# Patient Record
Sex: Female | Born: 1980 | Race: Black or African American | Hispanic: No | Marital: Single | Smoking: Former smoker
Health system: Southern US, Community
[De-identification: ages and names within clinical notes are randomized; demographics above are authoritative.]

## PROBLEM LIST (undated history)

## (undated) ENCOUNTER — Emergency Department (HOSPITAL_COMMUNITY): Admission: EM | Disposition: A | Discharge status: Home / Self Care | Payer: Medicaid Other

## (undated) DIAGNOSIS — M549 Dorsalgia, unspecified: Secondary | ICD-10-CM

## (undated) DIAGNOSIS — M5136 Other intervertebral disc degeneration, lumbar region: Secondary | ICD-10-CM

## (undated) DIAGNOSIS — M51369 Other intervertebral disc degeneration, lumbar region without mention of lumbar back pain or lower extremity pain: Secondary | ICD-10-CM

## (undated) DIAGNOSIS — G5603 Carpal tunnel syndrome, bilateral upper limbs: Secondary | ICD-10-CM

## (undated) DIAGNOSIS — G8929 Other chronic pain: Secondary | ICD-10-CM

## (undated) DIAGNOSIS — D649 Anemia, unspecified: Secondary | ICD-10-CM

## (undated) HISTORY — PX: CHOLECYSTECTOMY: SHX55

## (undated) HISTORY — PX: CERVICAL BIOPSY: SHX590

---

## 2013-06-21 HISTORY — PX: ROTATOR CUFF REPAIR: SHX139

## 2014-03-21 ENCOUNTER — Emergency Department (HOSPITAL_COMMUNITY)
Admission: EM | Admit: 2014-03-21 | Discharge: 2014-03-21 | Disposition: A | Discharge status: Home / Self Care | Payer: Medicaid Other | Attending: Emergency Medicine | Admitting: Emergency Medicine

## 2014-03-21 ENCOUNTER — Encounter (HOSPITAL_COMMUNITY): Payer: Self-pay | Admitting: Emergency Medicine

## 2014-03-21 DIAGNOSIS — H9202 Otalgia, left ear: Secondary | ICD-10-CM | POA: Insufficient documentation

## 2014-03-21 DIAGNOSIS — J029 Acute pharyngitis, unspecified: Secondary | ICD-10-CM | POA: Diagnosis present

## 2014-03-21 DIAGNOSIS — J069 Acute upper respiratory infection, unspecified: Secondary | ICD-10-CM | POA: Insufficient documentation

## 2014-03-21 DIAGNOSIS — M25519 Pain in unspecified shoulder: Secondary | ICD-10-CM | POA: Diagnosis not present

## 2014-03-21 DIAGNOSIS — J0110 Acute frontal sinusitis, unspecified: Secondary | ICD-10-CM | POA: Diagnosis not present

## 2014-03-21 MED ORDER — AMOXICILLIN 500 MG PO CAPS: 500.0000 mg | ORAL_CAPSULE | Freq: Three times a day (TID) | ORAL | Status: DC

## 2014-03-21 MED ORDER — AMOXICILLIN 500 MG PO CAPS
500.0000 mg | ORAL_CAPSULE | Freq: Three times a day (TID) | ORAL | Status: DC
Start: 2014-03-21 — End: 2014-03-21

## 2014-03-21 MED ORDER — FLUTICASONE PROPIONATE 50 MCG/ACT NA SUSP: 2.0000 | Freq: Every day | NASAL | Status: DC

## 2014-03-21 NOTE — ED Provider Notes (Signed)
Medical screening examination/treatment/procedure(s) were performed by non-physician practitioner and as supervising physician I was immediately available for consultation/collaboration.  Arla Boutwell, MD 03/21/14 1526 

## 2014-03-21 NOTE — ED Notes (Signed)
Pt in c/o left earache and sore throat over the last few days, unsure of fever, also reports right shoulder pain from a injury a few months ago, no distress noted

## 2014-03-21 NOTE — Discharge Instructions (Signed)
Take antibiotic to completion. Use nasal spray as directed. Follow up with one of the orthopedists. Another orthopedic facility is [facility] [phone] Upper Respiratory Infection, Adult An upper respiratory infection (URI) is also sometimes known as the common cold. The upper respiratory tract includes the nose, sinuses, throat, trachea, and bronchi. Bronchi are the airways leading to the lungs. Most people improve within 1 week, but symptoms can last up to 2 weeks. A residual cough may last even longer.  CAUSES Many different viruses can infect the tissues lining the upper respiratory tract. The tissues become irritated and inflamed and often become very moist. Mucus production is also common. A cold is contagious. You can easily spread the virus to others by oral contact. This includes kissing, sharing a glass, coughing, or sneezing. Touching your mouth or nose and then touching a surface, which is then touched by another person, can also spread the virus. SYMPTOMS  Symptoms typically develop 1 to 3 days after you come in contact with a cold virus. Symptoms vary from person to person. They may include:  Runny nose.  Sneezing.  Nasal congestion.  Sinus irritation.  Sore throat.  Loss of voice (laryngitis).  Cough.  Fatigue.  Muscle aches.  Loss of appetite.  Headache.  Low-grade fever. DIAGNOSIS  You might diagnose your own cold based on familiar symptoms, since most people get a cold 2 to 3 times a year. Your caregiver can confirm this based on your exam. Most importantly, your caregiver can check that your symptoms are not due to another disease such as strep throat, sinusitis, pneumonia, asthma, or epiglottitis. Blood tests, throat tests, and X-rays are not necessary to diagnose a common cold, but they may sometimes be helpful in excluding other more serious diseases. Your caregiver will decide if any further tests are required. RISKS AND COMPLICATIONS  You may be at  risk for a more severe case of the common cold if you smoke cigarettes, have chronic heart disease (such as heart failure) or lung disease (such as asthma), or if you have a weakened immune system. The very young and very old are also at risk for more serious infections. Bacterial sinusitis, middle ear infections, and bacterial pneumonia can complicate the common cold. The common cold can worsen asthma and chronic obstructive pulmonary disease (COPD). Sometimes, these complications can require emergency medical care and may be life-threatening. PREVENTION  The best way to protect against getting a cold is to practice good hygiene. Avoid oral or hand contact with people with cold symptoms. Wash your hands often if contact occurs. There is no clear evidence that vitamin C, vitamin E, echinacea, or exercise reduces the chance of developing a cold. However, it is always recommended to get plenty of rest and practice good nutrition. TREATMENT  Treatment is directed at relieving symptoms. There is no cure. Antibiotics are not effective, because the infection is caused by a virus, not by bacteria. Treatment may include:  Increased fluid intake. Sports drinks offer valuable electrolytes, sugars, and fluids.  Breathing heated mist or steam (vaporizer or shower).  Eating chicken soup or other clear broths, and maintaining good nutrition.  Getting plenty of rest.  Using gargles or lozenges for comfort.  Controlling fevers with ibuprofen or acetaminophen as directed by your caregiver.  Increasing usage of your inhaler if you have asthma. Zinc gel and zinc lozenges, taken in the first 24 hours of the common cold, can shorten the duration and lessen the severity of symptoms. Pain medicines may help  with fever, muscle aches, and throat pain. A variety of non-prescription medicines are available to treat congestion and runny nose. Your caregiver can make recommendations and may suggest nasal or lung inhalers for  other symptoms.  HOME CARE INSTRUCTIONS   Only take over-the-counter or prescription medicines for pain, discomfort, or fever as directed by your caregiver.  Use a warm mist humidifier or inhale steam from a shower to increase air moisture. This may keep secretions moist and make it easier to breathe.  Drink enough water and fluids to keep your urine clear or pale yellow.  Rest as needed.  Return to work when your temperature has returned to normal or as your caregiver advises. You may need to stay home longer to avoid infecting others. You can also use a face mask and careful hand washing to prevent spread of the virus. SEEK MEDICAL CARE IF:   After the first few days, you feel you are getting worse rather than better.  You need your caregiver's advice about medicines to control symptoms.  You develop chills, worsening shortness of breath, or brown or red sputum. These may be signs of pneumonia.  You develop yellow or brown nasal discharge or pain in the face, especially when you bend forward. These may be signs of sinusitis.  You develop a fever, swollen neck glands, pain with swallowing, or white areas in the back of your throat. These may be signs of strep throat. SEEK IMMEDIATE MEDICAL CARE IF:   You have a fever.  You develop severe or persistent headache, ear pain, sinus pain, or chest pain.  You develop wheezing, a prolonged cough, cough up blood, or have a change in your usual mucus (if you have chronic lung disease).  You develop sore muscles or a stiff neck. Document Released: 12/01/2000 Document Revised: 08/30/2011 Document Reviewed: 09/12/2013 [facility] Patient Information 2015 [employer] [place]. This information is not intended to replace advice given to you by your health care provider. Make sure you discuss any questions you have with your health care provider.  Sinusitis Sinusitis is redness, soreness, and inflammation of the paranasal sinuses. Paranasal sinuses are  air pockets within the bones of your face (beneath the eyes, the middle of the forehead, or above the eyes). In healthy paranasal sinuses, mucus is able to drain out, and air is able to circulate through them by way of your nose. However, when your paranasal sinuses are inflamed, mucus and air can become trapped. This can allow bacteria and other germs to grow and cause infection. Sinusitis can develop quickly and last only a short time (acute) or continue over a long period (chronic). Sinusitis that lasts for more than 12 weeks is considered chronic.  CAUSES  Causes of sinusitis include:  Allergies.  Structural abnormalities, such as displacement of the cartilage that separates your nostrils (deviated septum), which can decrease the air flow through your nose and sinuses and affect sinus drainage.  Functional abnormalities, such as when the small hairs (cilia) that line your sinuses and help remove mucus do not work properly or are not present. SIGNS AND SYMPTOMS  Symptoms of acute and chronic sinusitis are the same. The primary symptoms are pain and pressure around the affected sinuses. Other symptoms include:  Upper toothache.  Earache.  Headache.  Bad breath.  Decreased sense of smell and taste.  A cough, which worsens when you are lying flat.  Fatigue.  Fever.  Thick drainage from your nose, which often is green and may contain pus (  purulent).  Swelling and warmth over the affected sinuses. DIAGNOSIS  Your health care provider will perform a physical exam. During the exam, your health care provider may:  Look in your nose for signs of abnormal growths in your nostrils (nasal polyps).  Tap over the affected sinus to check for signs of infection.  View the inside of your sinuses (endoscopy) using an imaging device that has a light attached (endoscope). If your health care provider suspects that you have chronic sinusitis, one or more of the following tests may be  recommended:  Allergy tests.  Nasal culture. A sample of mucus is taken from your nose, sent to a lab, and screened for bacteria.  Nasal cytology. A sample of mucus is taken from your nose and examined by your health care provider to determine if your sinusitis is related to an allergy. TREATMENT  Most cases of acute sinusitis are related to a viral infection and will resolve on their own within 10 days. Sometimes medicines are prescribed to help relieve symptoms (pain medicine, decongestants, nasal steroid sprays, or saline sprays).  However, for sinusitis related to a bacterial infection, your health care provider will prescribe antibiotic medicines. These are medicines that will help kill the bacteria causing the infection.  Rarely, sinusitis is caused by a fungal infection. In theses cases, your health care provider will prescribe antifungal medicine. For some cases of chronic sinusitis, surgery is needed. Generally, these are cases in which sinusitis recurs more than 3 times per year, despite other treatments. HOME CARE INSTRUCTIONS   Drink plenty of water. Water helps thin the mucus so your sinuses can drain more easily.  Use a humidifier.  Inhale steam 3 to 4 times a day (for example, sit in the bathroom with the shower running).  Apply a warm, moist washcloth to your face 3 to 4 times a day, or as directed by your health care provider.  Use saline nasal sprays to help moisten and clean your sinuses.  Take medicines only as directed by your health care provider.  If you were prescribed either an antibiotic or antifungal medicine, finish it all even if you start to feel better. SEEK IMMEDIATE MEDICAL CARE IF:  You have increasing pain or severe headaches.  You have nausea, vomiting, or drowsiness.  You have swelling around your face.  You have vision problems.  You have a stiff neck.  You have difficulty breathing. MAKE SURE YOU:   Understand these  instructions.  Will watch your condition.  Will get help right away if you are not doing well or get worse. Document Released: 06/07/2005 Document Revised: 10/22/2013 Document Reviewed: 06/22/2011 [facility] Patient Information 2015 [place], [place]. This information is not intended to replace advice given to you by your health care provider. Make sure you discuss any questions you have with your health care provider.

## 2014-03-21 NOTE — ED Provider Notes (Signed)
CSN: [id number]     Arrival date & time 03/21/14  1233 History  This chart was scribed for non-physician practitioner, Johnnette Gourdobyn Albert, PA-C, working with Gerhard Munchobert Lockwood, MD by Charline BillsEssence Howell, ED Scribe. This patient was seen in room TR09C/TR09C and the patient's care was started at 1:06 PM.   Chief Complaint  Patient presents with  . Otalgia  . Sore Throat  . Shoulder Pain   The history is provided by the patient. No language interpreter was used.   HPI Comments: Maria Mcmahon is a 33 y.o. female who presents to the Emergency Department complaining of constant L ear pain onset 1 week ago. She reports associated sore throat, postnasal drip, sinus pressure, congestion. She denies fever. Pt has tried OTC sinus medication, ibuprofen and Tylenol with mild relief. Pt also requests referral to ortho for R shoulder pain onset 3.5 weeks. She was seen at an ED in the area she recently moved from, states 3 weeks ago she was moving and hurt the right side of her neck and arm. She was advised to f/u with ortho, however recently got insurance and needs resources in this area.  History reviewed. No pertinent past medical history. No past surgical history on file. No family history on file. History  Substance Use Topics  . Smoking status: Not on file  . Smokeless tobacco: Not on file  . Alcohol Use: Not on file   OB History   Grav Para Term Preterm Abortions TAB SAB Ect Mult Living                 Review of Systems  HENT: Positive for congestion, ear pain, postnasal drip, sinus pressure and sore throat.   Musculoskeletal: Positive for arthralgias.  All other systems reviewed and are negative.  Allergies  Review of patient's allergies indicates no known allergies.  Home Medications   Prior to Admission medications   Medication Sig Start Date End Date Taking? Authorizing Provider  amoxicillin (AMOXIL) 500 MG capsule Take 1 capsule (500 mg total) by mouth 3 (three) times daily. 03/21/14   Trevor Maceobyn  M Albert, PA-C  fluticasone (FLONASE) 50 MCG/ACT nasal spray Place 2 sprays into both nostrils daily. 03/21/14   Trevor Maceobyn M Albert, PA-C   Triage Vitals: BP 125/79  Pulse 76  Temp(Src) 98.5 F (36.9 C) (Oral)  Resp 18  Ht 5\' 9"  (1.753 m)  Wt 270 lb (122.471 kg)  BMI 39.85 kg/m2  SpO2 96% Physical Exam  Nursing note and vitals reviewed. Constitutional: She is oriented to person, place, and time. She appears well-developed and well-nourished. No distress.  HENT:  Head: Normocephalic and atraumatic.  Nose: Right sinus exhibits frontal sinus tenderness. Left sinus exhibits frontal sinus tenderness.  Mouth/Throat: Oropharynx is clear and moist.  Ears: Dullness behind bilateral TMs Nose: Bilateral frontal sinus tenderness Nasal congestion and postnasal drip  Eyes: Conjunctivae and EOM are normal.  Neck: Normal range of motion. Neck supple.  Cardiovascular: Normal rate, regular rhythm and normal heart sounds.   Pulmonary/Chest: Effort normal and breath sounds normal. No respiratory distress.  Musculoskeletal: Normal range of motion. She exhibits no edema.  Neurological: She is alert and oriented to person, place, and time. No sensory deficit.  Skin: Skin is warm and dry.  Psychiatric: She has a normal mood and affect. Her behavior is normal.   ED Course  Procedures (including critical care time) DIAGNOSTIC STUDIES: Oxygen Saturation is 96% on RAn, normal by my interpretation.    COORDINATION OF CARE: 1:12 PM-Discussed  treatment plan which includes antibiotics with pt at bedside and pt agreed to plan.   Labs Review Labs Reviewed - No data to display  Imaging Review No results found.   EKG Interpretation None      MDM   Final diagnoses:  Acute frontal sinusitis, recurrence not specified  URI (upper respiratory infection)   Pt well appearing and in NAD. AFVSS. Symptoms present 1 week, unresolved with OTC medications. Will treat with amoxil, flonase. Resources given for  orthopedic f/u as requested. Stable for d/c. Return precautions given. Patient states understanding of treatment care plan and is agreeable.  I personally performed the services described in this documentation, which was scribed in my presence. The recorded information has been reviewed and is accurate.    Trevor Mace, PA-C 03/21/14 1330

## 2014-04-05 ENCOUNTER — Other Ambulatory Visit (HOSPITAL_COMMUNITY): Payer: Self-pay | Admitting: Orthopaedic Surgery

## 2014-04-05 DIAGNOSIS — M25511 Pain in right shoulder: Secondary | ICD-10-CM

## 2014-04-22 ENCOUNTER — Ambulatory Visit
Admission: RE | Admit: 2014-04-22 | Discharge: 2014-04-22 | Disposition: A | Discharge status: Home / Self Care | Payer: Medicaid Other | Source: Ambulatory Visit | Attending: Orthopaedic Surgery | Admitting: Orthopaedic Surgery

## 2014-04-22 DIAGNOSIS — M25511 Pain in right shoulder: Secondary | ICD-10-CM

## 2014-04-22 MED ORDER — IOHEXOL 180 MG/ML  SOLN
12.0000 mL | Freq: Once | INTRAMUSCULAR | Status: AC | PRN
  Administered 2014-04-22: 12 mL via INTRA_ARTICULAR

## 2014-04-23 ENCOUNTER — Other Ambulatory Visit: Payer: Self-pay | Admitting: Orthopaedic Surgery

## 2014-04-23 DIAGNOSIS — M25511 Pain in right shoulder: Secondary | ICD-10-CM

## 2014-06-13 ENCOUNTER — Ambulatory Visit: Payer: Medicaid Other | Admitting: Physical Therapy

## 2014-06-18 ENCOUNTER — Ambulatory Visit: Payer: Medicaid Other | Admitting: Physical Therapy

## 2014-06-24 ENCOUNTER — Ambulatory Visit: Payer: Medicaid Other | Attending: Orthopaedic Surgery | Admitting: Physical Therapy

## 2014-06-24 DIAGNOSIS — M25511 Pain in right shoulder: Secondary | ICD-10-CM | POA: Insufficient documentation

## 2014-06-24 DIAGNOSIS — Z4789 Encounter for other orthopedic aftercare: Secondary | ICD-10-CM | POA: Diagnosis present

## 2014-07-08 ENCOUNTER — Ambulatory Visit: Payer: Medicaid Other | Admitting: Physical Therapy

## 2014-07-08 DIAGNOSIS — Z4789 Encounter for other orthopedic aftercare: Secondary | ICD-10-CM | POA: Diagnosis not present

## 2014-07-11 ENCOUNTER — Emergency Department (HOSPITAL_COMMUNITY): Payer: Medicaid Other

## 2014-07-11 ENCOUNTER — Emergency Department (HOSPITAL_COMMUNITY)
Admission: EM | Admit: 2014-07-11 | Discharge: 2014-07-11 | Disposition: A | Discharge status: Home / Self Care | Payer: Medicaid Other | Attending: Emergency Medicine | Admitting: Emergency Medicine

## 2014-07-11 ENCOUNTER — Encounter (HOSPITAL_COMMUNITY): Payer: Self-pay | Admitting: *Deleted

## 2014-07-11 DIAGNOSIS — Y92008 Other place in unspecified non-institutional (private) residence as the place of occurrence of the external cause: Secondary | ICD-10-CM | POA: Insufficient documentation

## 2014-07-11 DIAGNOSIS — W108XXA Fall (on) (from) other stairs and steps, initial encounter: Secondary | ICD-10-CM | POA: Insufficient documentation

## 2014-07-11 DIAGNOSIS — S3992XA Unspecified injury of lower back, initial encounter: Secondary | ICD-10-CM | POA: Diagnosis present

## 2014-07-11 DIAGNOSIS — Y998 Other external cause status: Secondary | ICD-10-CM | POA: Insufficient documentation

## 2014-07-11 DIAGNOSIS — Y9389 Activity, other specified: Secondary | ICD-10-CM | POA: Insufficient documentation

## 2014-07-11 DIAGNOSIS — M25552 Pain in left hip: Secondary | ICD-10-CM

## 2014-07-11 DIAGNOSIS — Z7951 Long term (current) use of inhaled steroids: Secondary | ICD-10-CM | POA: Diagnosis not present

## 2014-07-11 DIAGNOSIS — S199XXA Unspecified injury of neck, initial encounter: Secondary | ICD-10-CM | POA: Diagnosis not present

## 2014-07-11 DIAGNOSIS — W19XXXA Unspecified fall, initial encounter: Secondary | ICD-10-CM

## 2014-07-11 DIAGNOSIS — S24109A Unspecified injury at unspecified level of thoracic spinal cord, initial encounter: Secondary | ICD-10-CM | POA: Insufficient documentation

## 2014-07-11 DIAGNOSIS — S79912A Unspecified injury of left hip, initial encounter: Secondary | ICD-10-CM | POA: Insufficient documentation

## 2014-07-11 DIAGNOSIS — Z792 Long term (current) use of antibiotics: Secondary | ICD-10-CM | POA: Insufficient documentation

## 2014-07-11 DIAGNOSIS — M549 Dorsalgia, unspecified: Secondary | ICD-10-CM

## 2014-07-11 MED ORDER — NAPROXEN 500 MG PO TABS: 500.0000 mg | ORAL_TABLET | Freq: Two times a day (BID) | ORAL | Status: AC

## 2014-07-11 MED ORDER — NAPROXEN 250 MG PO TABS
500.0000 mg | ORAL_TABLET | Freq: Once | ORAL | Status: AC
  Administered 2014-07-11: 500 mg via ORAL
  Filled 2014-07-11: qty 2

## 2014-07-11 MED ORDER — HYDROCODONE-ACETAMINOPHEN 5-325 MG PO TABS
1.0000 | ORAL_TABLET | ORAL | Status: DC | PRN
Start: 2014-07-11 — End: 2015-07-11

## 2014-07-11 MED ORDER — HYDROMORPHONE HCL 1 MG/ML IJ SOLN
2.0000 mg | Freq: Once | INTRAMUSCULAR | Status: AC
  Administered 2014-07-11: 2 mg via INTRAMUSCULAR
  Filled 2014-07-11: qty 2

## 2014-07-11 MED ORDER — HYDROMORPHONE HCL 1 MG/ML IJ SOLN
2.0000 mg | Freq: Once | INTRAMUSCULAR | Status: DC
  Filled 2014-07-11: qty 2

## 2014-07-11 MED ORDER — ONDANSETRON 4 MG PO TBDP
8.0000 mg | ORAL_TABLET | Freq: Once | ORAL | Status: AC
  Administered 2014-07-11: 8 mg via ORAL
  Filled 2014-07-11: qty 2

## 2014-07-11 NOTE — ED Provider Notes (Signed)
CSN: [id number]     Arrival date & time 07/11/14  1048 History  This chart was scribed for non-physician practitioner, Harle BattiestElizabeth Turner Baillie, NP-C, working with Lyanne CoKevin M Campos, MD by Charline BillsEssence Howell, ED Scribe. This patient was seen in room TR09C/TR09C and the patient's care was started at 11:16 AM.   Chief Complaint  Patient presents with  . Fall   The history is provided by the patient. No language interpreter was used.   HPI Comments: Maria Mcmahon is a 34 y.o. female who presents to the Emergency Department complaining of fall that occurred yesterday. Pt states that she was walking down stairs at her house when she missed one and fell. She reports landing on her L hip and L ribs. Pt reports secondary neck pain, L rib pain, mid to lower back pain that she currently rates as 10/10. Pain is exacerbated with sitting upright and inspiration. Pt denies fever, urinary or bowel incontinence. No h/o CA or IV drug use. No known allergies.  History reviewed. No pertinent past medical history. History reviewed. No pertinent past surgical history. History reviewed. No pertinent family history. History  Substance Use Topics  . Smoking status: Not on file  . Smokeless tobacco: Not on file  . Alcohol Use: Not on file   OB History    No data available     Review of Systems  Constitutional: Negative for fever.  Musculoskeletal: Positive for back pain, arthralgias and neck pain.  All other systems reviewed and are negative.  Allergies  Review of patient's allergies indicates no known allergies.  Home Medications   Prior to Admission medications   Medication Sig Start Date End Date Taking? Authorizing Provider  amoxicillin (AMOXIL) 500 MG capsule Take 1 capsule (500 mg total) by mouth 3 (three) times daily. 03/21/14   Robyn M Hess, PA-C  fluticasone (FLONASE) 50 MCG/ACT nasal spray Place 2 sprays into both nostrils daily. 03/21/14   [name], PA-C   Triage Vitals: BP 117/76 mmHg  Pulse 75   Temp(Src) 98.1 F (36.7 C) (Oral)  Resp 17  Ht 5\' 9"  (1.753 m)  Wt 290 lb (131.543 kg)  BMI 42.81 kg/m2  SpO2 99% Physical Exam  Constitutional: She is oriented to person, place, and time. She appears well-developed and well-nourished. No distress.  HENT:  Head: Normocephalic and atraumatic.  Eyes: Conjunctivae and EOM are normal.  Neck: Normal range of motion. Neck supple.  Cardiovascular: Normal rate.   Pulmonary/Chest: Effort normal and breath sounds normal.  Lungs are clear.   Musculoskeletal: Normal range of motion.  Tenderness to palpation in bilateral thoracic and lumbar paraspinous muscles. Tenderness to palpation into L SI joint. Tenderness to palpation to R sternocleidomastoid muscle. Pt is able to ambulate. 5/5 grip strength in bilateral UE and bilat LE  Neurological: She is alert and oriented to person, place, and time. No cranial nerve deficit. Coordination normal.  Skin: Skin is warm and dry.  Psychiatric: She has a normal mood and affect. Her behavior is normal.  Nursing note and vitals reviewed.  ED Course  Procedures (including critical care time) DIAGNOSTIC STUDIES: Oxygen Saturation is 99% on RA, normal by my interpretation.    COORDINATION OF CARE: 11:28 AM-Discussed treatment plan which includes XR, Dilaudid injection, Naproxen and follow-up with ortho with pt at bedside and pt agreed to plan.   Labs Review Labs Reviewed - No data to display  Imaging Review Dg Thoracic Spine 2 View  07/11/2014   CLINICAL DATA:  Pain  following fall down stairs  EXAM: THORACIC SPINE - 3 VIEW  COMPARISON:  None.  FINDINGS: Frontal, lateral, and swimmer's views were obtained. No fracture or spondylolisthesis. Disc spaces appear intact. No erosive change.  IMPRESSION: No fracture or spondylolisthesis.  No appreciable arthropathy.   Electronically Signed   By: Bretta Bang M.D.   On: 07/11/2014 12:17   Dg Lumbar Spine Complete  07/11/2014   CLINICAL DATA:  Pain following  fall down stairs  EXAM: LUMBAR SPINE - COMPLETE 4+ VIEW  COMPARISON:  None.  FINDINGS: Frontal, lateral, spot lumbosacral lateral, and bilateral oblique views were obtained. There are 5 non-rib-bearing lumbar type vertebral bodies. There is no fracture or spondylolisthesis. There is moderate disc space narrowing at L4-5 and L5-S1. Other disc spaces appear unremarkable. There is facet osteoarthritic change at L5-S1 bilaterally.  IMPRESSION: Areas of osteoarthritic change.  No fracture or spondylolisthesis.   Electronically Signed   By: Bretta Bang M.D.   On: 07/11/2014 12:18   Dg Hip Unilat With Pelvis 2-3 Views Left  07/11/2014   CLINICAL DATA:  Pain following fall down stairs  EXAM: DG HIP W/ PELVIS 2-3V*L*  COMPARISON:  None.  FINDINGS: Frontal pelvis as well as frontal and lateral left hip images were obtained. There is no fracture or dislocation. Joint spaces appear intact. No erosive change.  IMPRESSION: No fracture or dislocation.  No appreciable arthropathy.   Electronically Signed   By: Bretta Bang M.D.   On: 07/11/2014 12:25     EKG Interpretation None      MDM   Final diagnoses:  Fall  Bilateral back pain, unspecified location  Hip pain, left   33 with complaint of hip pain and back pain after falling down steps in her home.  She has no neruo deficits and her xrays are negative. She is able to walk and pain improved in the ED. No loss of bowel or bladder control and no concern for cauda equina.  RICE protocol and pain medicine indicated and discussed with patient. Pt is well-appearing, in no acute distress and vital signs are not concerning. stable.  She appears safe to be discharged.  Discharge include follow-up with their PCP.  Return precautions provided. Pt aware of plan and in agreement.   I personally performed the services described in this documentation, which was scribed in my presence. The recorded information has been reviewed and is accurate.  Filed Vitals:    07/11/14 1055 07/11/14 1228  BP: 117/76 109/57  Pulse: 75 51  Temp: 98.1 F (36.7 C)   TempSrc: Oral   Resp: 17 18  Height:  (1.753 m)   Weight: 290 lb (131.543 kg)   SpO2: 99% 100%   Meds given in ED:  Medications  ondansetron (ZOFRAN-ODT) disintegrating tablet 8 mg (8 mg Oral Given 07/11/14 1139)  naproxen (NAPROSYN) tablet 500 mg (500 mg Oral Given 07/11/14 1139)  HYDROmorphone (DILAUDID) injection 2 mg (2 mg Intramuscular Given 07/11/14 1200)    Discharge Medication List as of 07/11/2014 12:35 PM    START taking these medications   Details  HYDROcodone-acetaminophen (NORCO/VICODIN) 5-325 MG per tablet Take 1 tablet by mouth every 4 (four) hours as needed., Starting 07/11/2014, Until Discontinued, Print    naproxen (NAPROSYN) 500 MG tablet Take 1 tablet (500 mg total) by mouth 2 (two) times daily., Starting 07/11/2014, Until Discontinued, Print         Harle Battiest, NP 07/12/14 1206  Lyanne Co, MD 07/12/14 [phone]

## 2014-07-11 NOTE — ED Notes (Signed)
Pt in stating she fell down some stairs yesterday, hit her left rib/back area, also c/o pain to lower pain, pain is worse with inspiration, no distress noted

## 2014-07-11 NOTE — Discharge Instructions (Signed)
Please follow the directions provided.  Sure to follow-up with your primary care doctor to ensure you're getting better. If your pain does not improve he may follow-up with orthopedic doctor for further evaluation.  Take the naproxen twice a day to help with pain caused by inflammation. Take the Vicodin as needed for pain not relieved by the naproxen. Use heat on your sore areas. Don't hesitate to return for any new, worsening, or concerning symptoms.   SEEK IMMEDIATE MEDICAL CARE IF:  You have pain that radiates from your back into your legs.  You develop new bowel or bladder control problems.  You have unusual weakness or numbness in your arms or legs.  You develop nausea or vomiting.  You develop abdominal pain.  You feel faint.

## 2014-07-22 ENCOUNTER — Ambulatory Visit: Payer: Medicaid Other | Attending: Orthopaedic Surgery | Admitting: Physical Therapy

## 2014-07-22 DIAGNOSIS — M25511 Pain in right shoulder: Secondary | ICD-10-CM | POA: Insufficient documentation

## 2014-07-22 DIAGNOSIS — Z4789 Encounter for other orthopedic aftercare: Secondary | ICD-10-CM | POA: Insufficient documentation

## 2014-07-31 ENCOUNTER — Ambulatory Visit: Payer: Medicaid Other | Admitting: Physical Therapy

## 2014-07-31 DIAGNOSIS — M25511 Pain in right shoulder: Secondary | ICD-10-CM | POA: Diagnosis not present

## 2014-07-31 DIAGNOSIS — Z4789 Encounter for other orthopedic aftercare: Secondary | ICD-10-CM | POA: Diagnosis present

## 2014-08-13 ENCOUNTER — Other Ambulatory Visit: Payer: Self-pay | Admitting: Orthopaedic Surgery

## 2014-08-13 DIAGNOSIS — M545 Low back pain: Secondary | ICD-10-CM

## 2014-08-31 ENCOUNTER — Other Ambulatory Visit: Payer: Medicaid Other

## 2014-09-08 ENCOUNTER — Ambulatory Visit
Admission: RE | Admit: 2014-09-08 | Discharge: 2014-09-08 | Disposition: A | Discharge status: Home / Self Care | Payer: Medicaid Other | Source: Ambulatory Visit | Attending: Orthopaedic Surgery | Admitting: Orthopaedic Surgery

## 2014-09-08 DIAGNOSIS — M545 Low back pain: Secondary | ICD-10-CM

## 2015-07-11 ENCOUNTER — Emergency Department (HOSPITAL_COMMUNITY): Payer: No Typology Code available for payment source

## 2015-07-11 ENCOUNTER — Emergency Department (HOSPITAL_COMMUNITY)
Admission: EM | Admit: 2015-07-11 | Discharge: 2015-07-11 | Disposition: A | Discharge status: Home / Self Care | Payer: No Typology Code available for payment source | Attending: Emergency Medicine | Admitting: Emergency Medicine

## 2015-07-11 ENCOUNTER — Encounter (HOSPITAL_COMMUNITY): Payer: Self-pay

## 2015-07-11 DIAGNOSIS — S199XXA Unspecified injury of neck, initial encounter: Secondary | ICD-10-CM | POA: Diagnosis not present

## 2015-07-11 DIAGNOSIS — M542 Cervicalgia: Secondary | ICD-10-CM

## 2015-07-11 DIAGNOSIS — Z3202 Encounter for pregnancy test, result negative: Secondary | ICD-10-CM | POA: Insufficient documentation

## 2015-07-11 DIAGNOSIS — R51 Headache: Secondary | ICD-10-CM

## 2015-07-11 DIAGNOSIS — Y92410 Unspecified street and highway as the place of occurrence of the external cause: Secondary | ICD-10-CM | POA: Diagnosis not present

## 2015-07-11 DIAGNOSIS — Z85828 Personal history of other malignant neoplasm of skin: Secondary | ICD-10-CM | POA: Diagnosis not present

## 2015-07-11 DIAGNOSIS — Y999 Unspecified external cause status: Secondary | ICD-10-CM | POA: Diagnosis not present

## 2015-07-11 DIAGNOSIS — Z7951 Long term (current) use of inhaled steroids: Secondary | ICD-10-CM | POA: Diagnosis not present

## 2015-07-11 DIAGNOSIS — S3992XA Unspecified injury of lower back, initial encounter: Secondary | ICD-10-CM | POA: Insufficient documentation

## 2015-07-11 DIAGNOSIS — M25561 Pain in right knee: Secondary | ICD-10-CM

## 2015-07-11 DIAGNOSIS — G8929 Other chronic pain: Secondary | ICD-10-CM | POA: Diagnosis not present

## 2015-07-11 DIAGNOSIS — Y9389 Activity, other specified: Secondary | ICD-10-CM | POA: Diagnosis not present

## 2015-07-11 DIAGNOSIS — Z87891 Personal history of nicotine dependence: Secondary | ICD-10-CM | POA: Insufficient documentation

## 2015-07-11 DIAGNOSIS — S8991XA Unspecified injury of right lower leg, initial encounter: Secondary | ICD-10-CM | POA: Insufficient documentation

## 2015-07-11 DIAGNOSIS — S8992XA Unspecified injury of left lower leg, initial encounter: Secondary | ICD-10-CM | POA: Diagnosis not present

## 2015-07-11 DIAGNOSIS — S4991XA Unspecified injury of right shoulder and upper arm, initial encounter: Secondary | ICD-10-CM | POA: Insufficient documentation

## 2015-07-11 DIAGNOSIS — Z8739 Personal history of other diseases of the musculoskeletal system and connective tissue: Secondary | ICD-10-CM | POA: Diagnosis not present

## 2015-07-11 DIAGNOSIS — Z792 Long term (current) use of antibiotics: Secondary | ICD-10-CM | POA: Insufficient documentation

## 2015-07-11 DIAGNOSIS — S4992XA Unspecified injury of left shoulder and upper arm, initial encounter: Secondary | ICD-10-CM | POA: Insufficient documentation

## 2015-07-11 DIAGNOSIS — M25562 Pain in left knee: Secondary | ICD-10-CM

## 2015-07-11 DIAGNOSIS — M25511 Pain in right shoulder: Secondary | ICD-10-CM

## 2015-07-11 DIAGNOSIS — S0990XA Unspecified injury of head, initial encounter: Secondary | ICD-10-CM | POA: Insufficient documentation

## 2015-07-11 DIAGNOSIS — M25512 Pain in left shoulder: Secondary | ICD-10-CM

## 2015-07-11 DIAGNOSIS — R519 Headache, unspecified: Secondary | ICD-10-CM

## 2015-07-11 HISTORY — DX: Other intervertebral disc degeneration, lumbar region without mention of lumbar back pain or lower extremity pain: M51.369

## 2015-07-11 HISTORY — DX: Carpal tunnel syndrome, bilateral upper limbs: G56.03

## 2015-07-11 HISTORY — DX: Other chronic pain: G89.29

## 2015-07-11 HISTORY — DX: Other intervertebral disc degeneration, lumbar region: M51.36

## 2015-07-11 HISTORY — DX: Dorsalgia, unspecified: M54.9

## 2015-07-11 LAB — POC URINE PREG, ED: Preg Test, Ur: NEGATIVE

## 2015-07-11 MED ORDER — HYDROCODONE-ACETAMINOPHEN 5-325 MG PO TABS: 1.0000 | ORAL_TABLET | ORAL | Status: DC | PRN

## 2015-07-11 MED ORDER — IBUPROFEN 800 MG PO TABS: 800.0000 mg | ORAL_TABLET | Freq: Three times a day (TID) | ORAL | Status: DC

## 2015-07-11 MED ORDER — METHOCARBAMOL 500 MG PO TABS: 500.0000 mg | ORAL_TABLET | Freq: Two times a day (BID) | ORAL | Status: DC

## 2015-07-11 NOTE — ED Notes (Signed)
See PA secondary assessment 
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
## 2015-12-08 ENCOUNTER — Other Ambulatory Visit: Payer: Self-pay

## 2015-12-08 ENCOUNTER — Encounter (HOSPITAL_COMMUNITY): Payer: Self-pay | Admitting: *Deleted

## 2015-12-08 DIAGNOSIS — R1032 Left lower quadrant pain: Secondary | ICD-10-CM | POA: Diagnosis present

## 2015-12-08 DIAGNOSIS — Z87891 Personal history of nicotine dependence: Secondary | ICD-10-CM | POA: Insufficient documentation

## 2015-12-08 DIAGNOSIS — R079 Chest pain, unspecified: Secondary | ICD-10-CM | POA: Diagnosis not present

## 2015-12-08 DIAGNOSIS — N838 Other noninflammatory disorders of ovary, fallopian tube and broad ligament: Secondary | ICD-10-CM | POA: Diagnosis not present

## 2015-12-08 DIAGNOSIS — Z79899 Other long term (current) drug therapy: Secondary | ICD-10-CM | POA: Diagnosis not present

## 2015-12-08 MED ORDER — IOPAMIDOL (ISOVUE-300) INJECTION 61%
INTRAVENOUS | Status: AC
  Filled 2015-12-08: qty 100

## 2015-12-08 NOTE — ED Notes (Signed)
Pt states this afternoon she started experiencing upper abd pain that radiates to her chest. Also states she had a 103 fever. Denies N/V/D. States the pain started after drinking concentrated cranberry juice.

## 2015-12-09 ENCOUNTER — Emergency Department (HOSPITAL_COMMUNITY)
Admission: EM | Admit: 2015-12-09 | Discharge: 2015-12-09 | Disposition: A | Discharge status: Home / Self Care | Payer: Medicaid Other | Attending: Emergency Medicine | Admitting: Emergency Medicine

## 2015-12-09 ENCOUNTER — Encounter (HOSPITAL_COMMUNITY): Payer: Self-pay | Admitting: Radiology

## 2015-12-09 ENCOUNTER — Emergency Department (HOSPITAL_COMMUNITY): Payer: Medicaid Other

## 2015-12-09 DIAGNOSIS — N83209 Unspecified ovarian cyst, unspecified side: Secondary | ICD-10-CM

## 2015-12-09 LAB — URINALYSIS, ROUTINE W REFLEX MICROSCOPIC
BILIRUBIN URINE: NEGATIVE
Glucose, UA: NEGATIVE mg/dL
HGB URINE DIPSTICK: NEGATIVE
KETONES UR: NEGATIVE mg/dL
Leukocytes, UA: NEGATIVE
NITRITE: NEGATIVE
PH: 5.5 (ref 5.0–8.0)
Protein, ur: NEGATIVE mg/dL
Specific Gravity, Urine: 1.013 (ref 1.005–1.030)

## 2015-12-09 LAB — CBC
HEMATOCRIT: 34.6 % — AB (ref 36.0–46.0)
HEMOGLOBIN: 10.9 g/dL — AB (ref 12.0–15.0)
MCH: 24.3 pg — ABNORMAL LOW (ref 26.0–34.0)
MCHC: 31.5 g/dL (ref 30.0–36.0)
MCV: 77.1 fL — ABNORMAL LOW (ref 78.0–100.0)
Platelets: 319 10*3/uL (ref 150–400)
RBC: 4.49 MIL/uL (ref 3.87–5.11)
RDW: 16.2 % — AB (ref 11.5–15.5)
WBC: 9.1 10*3/uL (ref 4.0–10.5)

## 2015-12-09 LAB — BASIC METABOLIC PANEL
ANION GAP: 8 (ref 5–15)
BUN: 10 mg/dL (ref 6–20)
CO2: 21 mmol/L — ABNORMAL LOW (ref 22–32)
Calcium: 9.5 mg/dL (ref 8.9–10.3)
Chloride: 105 mmol/L (ref 101–111)
Creatinine, Ser: 0.94 mg/dL (ref 0.44–1.00)
Glucose, Bld: 97 mg/dL (ref 65–99)
POTASSIUM: 4 mmol/L (ref 3.5–5.1)
SODIUM: 134 mmol/L — AB (ref 135–145)

## 2015-12-09 LAB — HEPATIC FUNCTION PANEL
ALBUMIN: 3.8 g/dL (ref 3.5–5.0)
ALT: 12 U/L — AB (ref 14–54)
AST: 17 U/L (ref 15–41)
Alkaline Phosphatase: 78 U/L (ref 38–126)
TOTAL PROTEIN: 7.2 g/dL (ref 6.5–8.1)
Total Bilirubin: 0.3 mg/dL (ref 0.3–1.2)

## 2015-12-09 LAB — LIPASE, BLOOD: Lipase: 23 U/L (ref 11–51)

## 2015-12-09 LAB — I-STAT TROPONIN, ED: Troponin i, poc: 0 ng/mL (ref 0.00–0.08)

## 2015-12-09 LAB — POC URINE PREG, ED: Preg Test, Ur: NEGATIVE

## 2015-12-09 MED ORDER — HYDROCODONE-ACETAMINOPHEN 5-325 MG PO TABS: 1.0000 | ORAL_TABLET | ORAL | Status: AC | PRN

## 2015-12-09 MED ORDER — ONDANSETRON HCL 4 MG/2ML IJ SOLN
4.0000 mg | Freq: Once | INTRAMUSCULAR | Status: AC
  Administered 2015-12-09: 4 mg via INTRAVENOUS
  Filled 2015-12-09: qty 2

## 2015-12-09 MED ORDER — IOPAMIDOL (ISOVUE-300) INJECTION 61%
INTRAVENOUS | Status: AC
  Administered 2015-12-09: 100 mL
  Filled 2015-12-09: qty 100

## 2015-12-09 MED ORDER — SODIUM CHLORIDE 0.9 % IV BOLUS (SEPSIS)
500.0000 mL | Freq: Once | INTRAVENOUS | Status: AC
  Administered 2015-12-09: 500 mL via INTRAVENOUS

## 2015-12-09 MED ORDER — FENTANYL CITRATE (PF) 100 MCG/2ML IJ SOLN
50.0000 ug | Freq: Once | INTRAMUSCULAR | Status: AC
  Administered 2015-12-09: 50 ug via INTRAVENOUS
  Filled 2015-12-09: qty 2

## 2015-12-09 MED ORDER — MORPHINE SULFATE (PF) 4 MG/ML IV SOLN
4.0000 mg | Freq: Once | INTRAVENOUS | Status: AC
  Administered 2015-12-09: 4 mg via INTRAVENOUS
  Filled 2015-12-09: qty 1

## 2015-12-09 NOTE — ED Notes (Signed)
Patient transported to CT 

## 2015-12-09 NOTE — ED Notes (Signed)
Pt taken to restroom via wheelchair.

## 2015-12-09 NOTE — ED Provider Notes (Signed)
CSN: [id number]     Arrival date & time 12/08/15  2337 History  By signing my name below, I, Emmanuella Mensah, attest that this documentation has been prepared under the direction and in the presence of Gilda Creasehristopher J Pollina, MD. Electronically Signed: Angelene GiovanniEmmanuella Mensah, ED Scribe. 12/09/2015. 1:30 AM.    Chief Complaint  Patient presents with  . Chest Pain  . Abdominal Pain   Patient is a 35 y.o. female presenting with abdominal pain. The history is provided by the patient. No language interpreter was used.  Abdominal Pain Pain location:  LLQ Pain radiates to:  Epigastric region and chest Pain severity:  Moderate Onset quality:  Sudden Timing:  Constant Progression:  Worsening Chronicity:  New Relieved by:  None tried Worsened by:  Nothing tried Ineffective treatments:  Acetaminophen Associated symptoms: chest pain and fever   Associated symptoms: no diarrhea, no nausea and no vomiting    HPI Comments: Maria Mcmahon is a 35 y.o. female who presents to the Emergency Department complaining of gradually worsening constant left lower abdominal pain that is radiating upward towards her chest onset yesterday evening. She reports associated fever of 103 PTA, dizziness, and chest pain, She explains that her symptoms started immediately after drinking concentrated cranberry juice mixed with water. No alleviating factors noted. She states that she tried Tylenol 3 hours ago. She reports that she had an unknown insect bite to her right forearm 2 days ago which caused her arm to swell. She adds that the swelling is gradually improving. No n/v/d.    Past Medical History  Diagnosis Date  . Chronic back pain   . DDD (degenerative disc disease), lumbar   . Bilateral carpal tunnel syndrome    Past Surgical History  Procedure Laterality Date  . Rotator cuff repair  2015  . Cholecystectomy    . Cervical biopsy     No family history on file. Social History  Substance Use Topics  . Smoking  status: Former Smoker    Quit date: 06/09/2014  . Smokeless tobacco: None  . Alcohol Use: Yes   OB History    No data available     Review of Systems  Constitutional: Positive for fever.  Cardiovascular: Positive for chest pain.  Gastrointestinal: Positive for abdominal pain. Negative for nausea, vomiting and diarrhea.  Neurological: Positive for dizziness.  All other systems reviewed and are negative.     Allergies  Review of patient's allergies indicates no known allergies.  Home Medications   Prior to Admission medications   Medication Sig Start Date End Date Taking? Authorizing Provider  gabapentin (NEURONTIN) 300 MG capsule Take 300 mg by mouth 3 (three) times daily. 10/30/15  Yes Historical Provider, MD  amoxicillin (AMOXIL) 500 MG capsule Take 1 capsule (500 mg total) by mouth 3 (three) times daily. Patient not taking: Reported on 12/09/2015 03/21/14   [name], PA-C  fluticasone [facility](FLONASE) 50 MCG/ACT nasal spray Place 2 sprays into both nostrils daily. Patient not taking: Reported on 12/09/2015 03/21/14   [name], PA-C  HYDROcodone-acetaminophen (NORCO/VICODIN) 5-325 MG tablet Take 1 tablet by mouth every 4 (four) hours as needed. 12/09/15   Jacalyn LefevreJulie Audreena Sachdeva, MD  ibuprofen (ADVIL,MOTRIN) 800 MG tablet Take 1 tablet (800 mg total) by mouth 3 (three) times daily. Patient not taking: Reported on 12/09/2015 07/11/15   [name], PA-C  methocarbamol (ROBAXIN) 500 MG tablet Take 1 tablet (500 mg total) by mouth 2 (two) times daily. Patient not taking: Reported on 12/09/2015  07/11/15   Mady Gemma, PA-C  naproxen (NAPROSYN) 500 MG tablet Take 1 tablet (500 mg total) by mouth 2 (two) times daily. Patient not taking: Reported on 12/09/2015 07/11/14   Harle Battiest, NP   BP 103/64 mmHg  Pulse 67  Temp(Src) 98.4 F (36.9 C) (Oral)  Resp 17  Ht 5\' 9"  (1.753 m)  Wt 275 lb (124.739 kg)  BMI 40.59 kg/m2  SpO2 99%  LMP 11/16/2015 Physical Exam   Constitutional: She is oriented to person, place, and time. She appears well-developed and well-nourished. No distress.  HENT:  Head: Normocephalic and atraumatic.  Right Ear: Hearing normal.  Left Ear: Hearing normal.  Nose: Nose normal.  Mouth/Throat: Oropharynx is clear and moist and mucous membranes are normal.  Eyes: Conjunctivae and EOM are normal. Pupils are equal, round, and reactive to light.  Neck: Normal range of motion. Neck supple.  Cardiovascular: Regular rhythm, S1 normal and S2 normal.  Exam reveals no gallop and no friction rub.   No murmur heard. Pulmonary/Chest: Effort normal and breath sounds normal. No respiratory distress. She exhibits no tenderness.  Abdominal: Soft. Normal appearance and bowel sounds are normal. There is no hepatosplenomegaly. There is tenderness. There is no rebound, no guarding, no tenderness at McBurney's point and negative Murphy's sign. No hernia.  Diffuse tenderness  Musculoskeletal: Normal range of motion.  Neurological: She is alert and oriented to person, place, and time. She has normal strength. No cranial nerve deficit or sensory deficit. Coordination normal. GCS eye subscore is 4. GCS verbal subscore is 5. GCS motor subscore is 6.  Skin: Skin is warm, dry and intact. No rash noted. No cyanosis.  Psychiatric: She has a normal mood and affect. Her speech is normal and behavior is normal. Thought content normal.  Nursing note and vitals reviewed.   ED Course  Procedures (including critical care time) DIAGNOSTIC STUDIES: Oxygen Saturation is 98% on RA, normal by my interpretation.    COORDINATION OF CARE: 1:03 AM- Pt advised of plan for treatment and pt agrees. Pt will receive IV fluids, morphine, and Zofran. She will receive chest x-ray and lab work for further evaluation.    Labs Review Labs Reviewed  BASIC METABOLIC PANEL - Abnormal; Notable for the following:    Sodium 134 (*)    CO2 21 (*)    All other components within normal  limits  CBC - Abnormal; Notable for the following:    Hemoglobin 10.9 (*)    HCT 34.6 (*)    MCV 77.1 (*)    MCH 24.3 (*)    RDW 16.2 (*)    All other components within normal limits  HEPATIC FUNCTION PANEL - Abnormal; Notable for the following:    ALT 12 (*)    Bilirubin, Direct <0.1 (*)    All other components within normal limits  LIPASE, BLOOD  URINALYSIS, ROUTINE W REFLEX MICROSCOPIC (NOT AT [facility])  Rosezena Sensor, ED  POC URINE PREG, ED    Imaging Review Dg Chest 2 View  12/09/2015  CLINICAL DATA:  Acute onset of left lower chest pain. Initial encounter. EXAM: CHEST  2 VIEW COMPARISON:  Thoracic spine radiographs performed 07/11/2014 FINDINGS: The lungs are well-aerated. Mild vascular congestion is noted. Mild peribronchial thickening is seen. There is no evidence of pleural effusion or pneumothorax. The heart is normal in size; the mediastinal contour is within normal limits. No acute osseous abnormalities are seen. Clips are noted within the right upper quadrant, reflecting prior cholecystectomy. IMPRESSION:  Mild vascular congestion noted.  Mild peribronchial thickening seen. Electronically Signed   By: Roanna Raider M.D.   On: 12/09/2015 00:34   Ct Abdomen Pelvis W Contrast  12/09/2015  CLINICAL DATA:  35 year old female with upper abdominal pain radiating to the chest. EXAM: CT ABDOMEN AND PELVIS WITH CONTRAST TECHNIQUE: Multidetector CT imaging of the abdomen and pelvis was performed using the standard protocol following bolus administration of intravenous contrast. CONTRAST:  ISOVUE-300 IOPAMIDOL (ISOVUE-300) INJECTION 61% COMPARISON:  None. FINDINGS: The visualized lung bases are clear. No intra-abdominal free air. Trace free fluid may be present within the pelvis. Cholecystectomy. The liver, pancreas, spleen, adrenal glands, kidneys, visualized ureters, and urinary bladder appear unremarkable. The uterus is anteverted and grossly unremarkable. There is a 4.5 x 5.3 cm  complex cyst with a probable of vertebra on the containing internal debris in the left adnexa, likely hemorrhagic cyst. Ultrasound is recommended for better evaluation of the pelvic structures. The right ovary is grossly unremarkable. There is sigmoid diverticulosis and scattered colonic diverticula without active inflammatory changes. Moderate stool noted throughout the colon. There is no evidence of bowel obstruction or active inflammation. Normal appendix. The abdominal aorta and IVC appear unremarkable. No portal venous gas identified. There is no adenopathy. The abdominal wall soft tissues appear unremarkable with there is mild degenerative changes of the spine. No acute fracture. IMPRESSION: A 4.5 x 5.3 cm complex/hemorrhagic left ovarian cyst. Ultrasound is recommended for better evaluation of the pelvic structures. Sigmoid diverticulosis. No evidence of bowel obstruction or active inflammation. Normal appendix. Electronically Signed   By: Elgie Collard M.D.   On: 12/09/2015 07:03   Gilda Crease, MD has personally reviewed and evaluated these images and lab results as part of his medical decision-making.   EKG Interpretation None      MDM  Pt's pain has improved.  I will hold off on [place] for now as her pain is better.  Pt knows to return if it worsens.  The pt will be given the number to obgyn for f/u. Final diagnoses:  Hemorrhagic ovarian cyst      Jacalyn Lefevre, MD 12/09/15 [phone]

## 2016-06-12 ENCOUNTER — Emergency Department (HOSPITAL_COMMUNITY)
Admission: EM | Admit: 2016-06-12 | Discharge: 2016-06-12 | Disposition: A | Discharge status: Home / Self Care | Payer: Medicaid Other | Attending: Emergency Medicine | Admitting: Emergency Medicine

## 2016-06-12 ENCOUNTER — Encounter (HOSPITAL_COMMUNITY): Payer: Self-pay | Admitting: Emergency Medicine

## 2016-06-12 DIAGNOSIS — Z87891 Personal history of nicotine dependence: Secondary | ICD-10-CM | POA: Diagnosis not present

## 2016-06-12 DIAGNOSIS — H6992 Unspecified Eustachian tube disorder, left ear: Secondary | ICD-10-CM | POA: Insufficient documentation

## 2016-06-12 DIAGNOSIS — H6982 Other specified disorders of Eustachian tube, left ear: Secondary | ICD-10-CM

## 2016-06-12 DIAGNOSIS — H9202 Otalgia, left ear: Secondary | ICD-10-CM | POA: Diagnosis present

## 2016-06-12 HISTORY — DX: Anemia, unspecified: D64.9

## 2016-06-12 MED ORDER — PREDNISONE 50 MG PO TABS: 50.0000 mg | ORAL_TABLET | Freq: Every day | ORAL | 0 refills | Status: AC

## 2016-06-12 MED ORDER — ACETAMINOPHEN-CODEINE 120-12 MG/5ML PO SOLN: 10.0000 mL | ORAL | 0 refills | Status: AC | PRN

## 2016-06-12 MED ORDER — GUAIFENESIN ER 1200 MG PO TB12: 1.0000 | ORAL_TABLET | Freq: Two times a day (BID) | ORAL | 0 refills | Status: AC

## 2016-06-12 NOTE — Discharge Instructions (Signed)
Return here as needed.  Follow-up with a primary care doctor, increase your fluid intake

## 2016-06-12 NOTE — ED Notes (Signed)
Declined W/C at D/C and was escorted to lobby by RN. 

## 2016-06-12 NOTE — ED Provider Notes (Signed)
[facility]-EMERGENCY DEPT Provider Note   CSN: [id number] Arrival date & time: 06/12/16  [id number]  By signing my name below, I, Maria Mcmahon, attest that this documentation has been prepared under the direction and in the presence of Eli Lilly and CompanyChristopher Jayline Kilburg, PA-C.  Electronically Signed: Octavia HeirArianna Mcmahon, ED Scribe. 06/12/16. 10:32 AM.    History   Chief Complaint Chief Complaint  Patient presents with  . Otalgia   The history is provided by the patient. No language interpreter was used.   HPI Comments: Maria Mcmahon is a 35 y.o. female who presents to the Emergency Department complaining of sudden onset, gradual worsening, moderate, left ear pain x 7 hours. She notes associated headache, dizziness, and left sided lower preauricular swelling that is gradually improving. Pt describes the pain as a pressure like sensation. She reports having a tooth pulled on the same side about 3 weeks ago. She denies rhinorrhea or  congestion.    Past Medical History:  Diagnosis Date  . Anemia   . Bilateral carpal tunnel syndrome   . Chronic back pain   . DDD (degenerative disc disease), lumbar     There are no active problems to display for this patient.   Past Surgical History:  Procedure Laterality Date  . CERVICAL BIOPSY    . CHOLECYSTECTOMY    . ROTATOR CUFF REPAIR  2015    OB History    No data available       Home Medications    Prior to Admission medications   Medication Sig Start Date End Date Taking? Authorizing Provider  amoxicillin (AMOXIL) 500 MG capsule Take 1 capsule (500 mg total) by mouth 3 (three) times daily. Patient not taking: Reported on 12/09/2015 03/21/14   [name], PA-C  fluticasone [facility](FLONASE) 50 MCG/ACT nasal spray Place 2 sprays into both nostrils daily. Patient not taking: Reported on 12/09/2015 03/21/14   [name], PA-C  gabapentin (NEURONTIN) 300 MG capsule Take 300 mg by mouth 3 (three) times daily. 10/30/15   Historical Provider, MD    HYDROcodone-acetaminophen (NORCO/VICODIN) 5-325 MG tablet Take 1 tablet by mouth every 4 (four) hours as needed. 12/09/15   Jacalyn LefevreJulie Haviland, MD  ibuprofen (ADVIL,MOTRIN) 800 MG tablet Take 1 tablet (800 mg total) by mouth 3 (three) times daily. Patient not taking: Reported on 12/09/2015 07/11/15   [name], PA-C  methocarbamol (ROBAXIN) 500 MG tablet Take 1 tablet (500 mg total) by mouth 2 (two) times daily. Patient not taking: Reported on 12/09/2015 07/11/15   [name], PA-C  naproxen (NAPROSYN) 500 MG tablet Take 1 tablet (500 mg total) by mouth 2 (two) times daily. Patient not taking: Reported on 12/09/2015 07/11/14   [name], NP    Family History No family history on file.  Social History Social History  Substance Use Topics  . Smoking status: Former Smoker    Quit date: 06/09/2014  . Smokeless tobacco: Never Used  . Alcohol use Yes     Allergies   Patient has no known allergies.   Review of Systems Review of Systems  A complete 10 system review of systems was obtained and all systems are negative except as noted in the HPI and PMH.   Physical Exam Updated Vital Signs BP 124/75 (BP Location: Left Arm)   Pulse 84   Temp 97.6 F (36.4 C) (Oral)   Resp 18   Ht 5\' 9"  (1.753 m)   Wt 300 lb (136.1 kg)   LMP 06/12/2016   SpO2 100%  BMI 44.30 kg/m   Physical Exam  Constitutional: She is oriented to person, place, and time. She appears well-developed and well-nourished.  HENT:  Head: Normocephalic.  Preauricular left sided lymphnode swelling, bilateral mid ear effusion   Eyes: Lids are normal. Pupils are equal, round, and reactive to light.  Neck: Normal range of motion.  Cardiovascular: Normal rate and regular rhythm.   Pulmonary/Chest: Effort normal and breath sounds normal.  Musculoskeletal: Normal range of motion.  Lymphadenopathy:       Head (left side): Preauricular adenopathy present.  Neurological: She is alert and oriented to  person, place, and time.  Psychiatric: She has a normal mood and affect.  Nursing note and vitals reviewed.    ED Treatments / Results  DIAGNOSTIC STUDIES: Oxygen Saturation is 100% on RA, normal by my interpretation.  COORDINATION OF CARE:  10:12 AM Discussed treatment plan with pt at bedside and pt agreed to plan.  Labs (all labs ordered are listed, but only abnormal results are displayed) Labs Reviewed - No data to display  EKG  EKG Interpretation None       Radiology No results found.  Procedures Procedures (including critical care time)  Medications Ordered in ED Medications - No data to display   Initial Impression / Assessment and Plan / ED Course  I have reviewed the triage vital signs and the nursing notes.  Pertinent labs & imaging results that were available during my care of the patient were reviewed by me and considered in my medical decision making (see chart for details).  Clinical Course     Patient be treated for eustachian tube dysfunction.  Told to return here as needed.  Patient agrees the plan and all questions were answered  Final Clinical Impressions(s) / ED Diagnoses   Final diagnoses:  None   I personally performed the services described in this documentation, which was scribed in my presence. The recorded information has been reviewed and is accurate. New Prescriptions New Prescriptions   No medications on file     Charlestine NightChristopher Brooks Stotz, PA-C 06/12/16 1045    Gerhard Munchobert Lockwood, MD 06/16/16 0010

## 2016-06-12 NOTE — ED Triage Notes (Signed)
Pt c/o a knot near her left ear causing ear pain onset 0300 today. Pt also reports headache. Pt denies N/V.

## 2016-12-07 IMAGING — CT CT ABD-PELV W/ CM
2 of 4 series · 16 of 46 positions shown, 18 images · IV contrast (Omni 300)
Comparison: None.

CLINICAL DATA: 34-year-old female with upper abdominal pain
radiating to the chest.

EXAM:
CT ABDOMEN AND PELVIS WITH CONTRAST
TECHNIQUE: Multidetector CT imaging of the abdomen and pelvis was performed
using the standard protocol following bolus administration of
intravenous contrast.
CONTRAST:  100mL K9BOAW-266 IOPAMIDOL (K9BOAW-266) INJECTION 61%

[Series 2: a/p w/ 5mm · axial · 0.87mm/px · z∈[+735,+1225]mm · 13 of 108 slices shown, 15 images]
[im 5/108  soft-tissue]
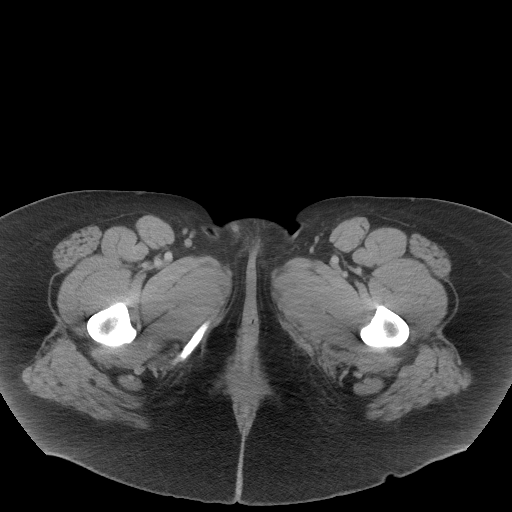
[im 5/108  bone]
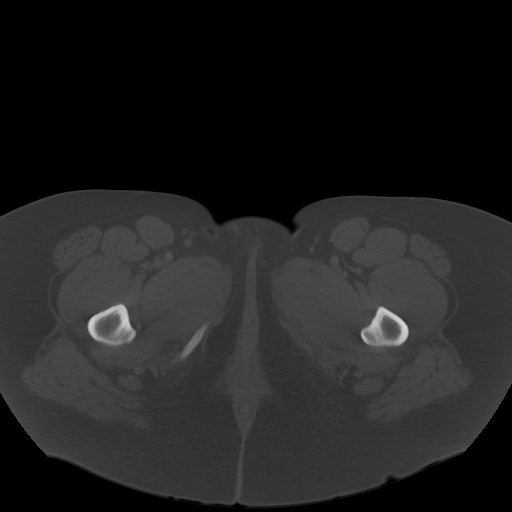
[im 13/108  soft-tissue]
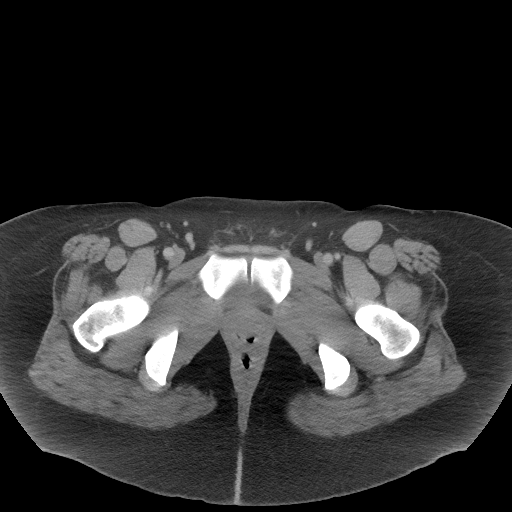
[im 22/108  soft-tissue]
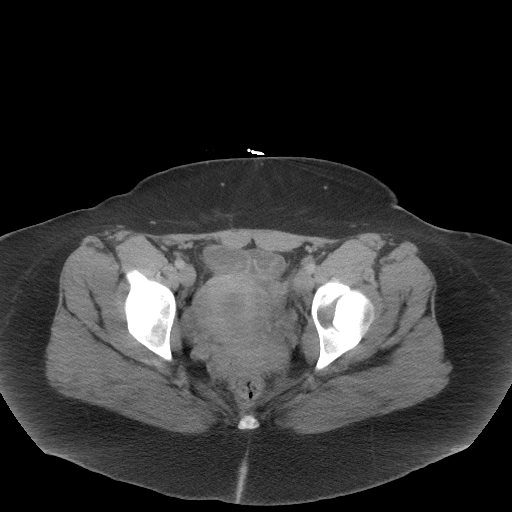
[im 30/108  soft-tissue]
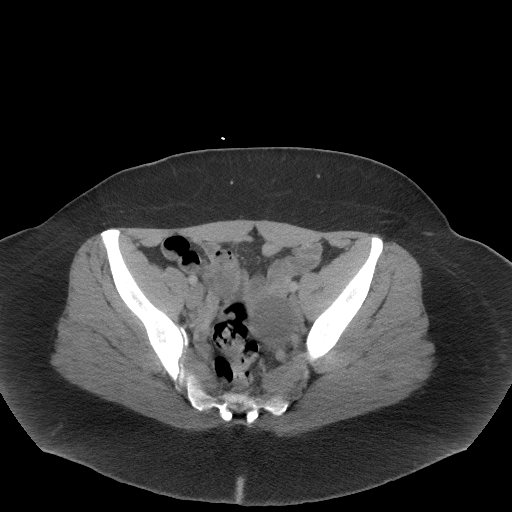
[im 39/108  soft-tissue]
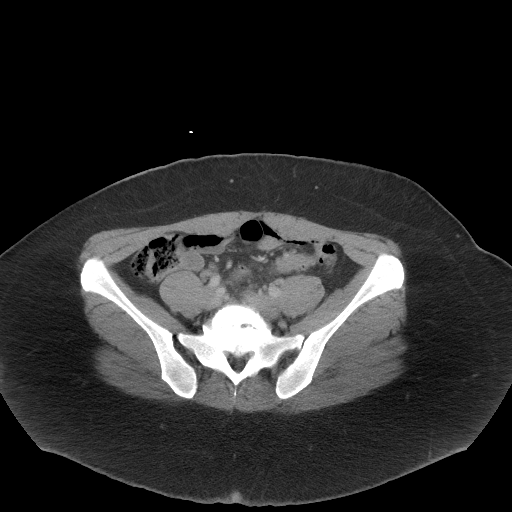
[im 48/108  soft-tissue]
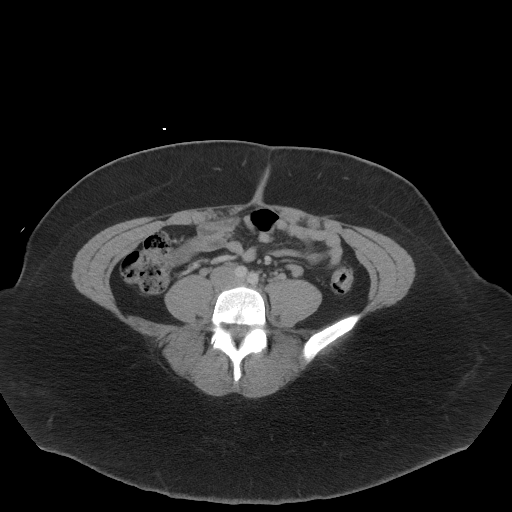
[im 56/108  soft-tissue]
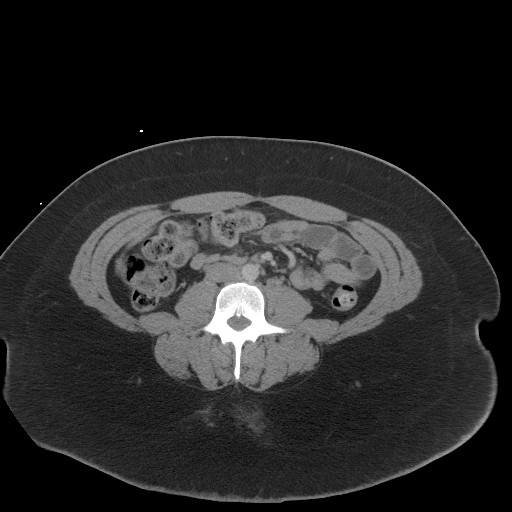
[im 60/108  soft-tissue]
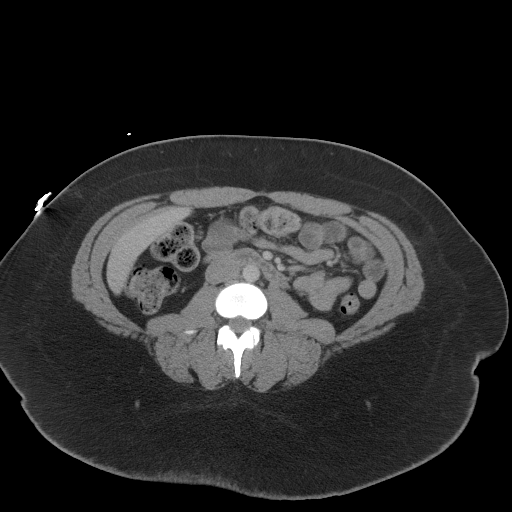
[im 69/108  soft-tissue]
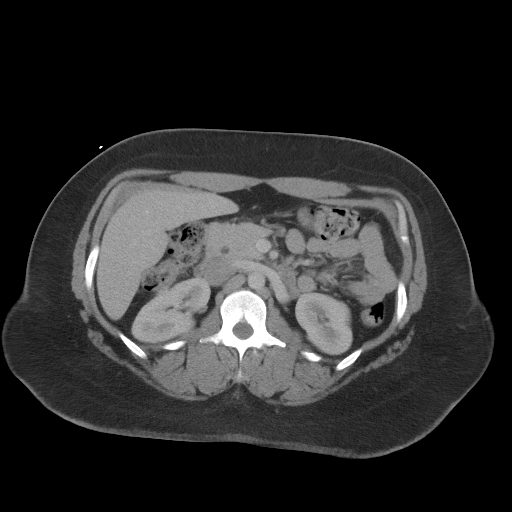
[im 69/108  bone]
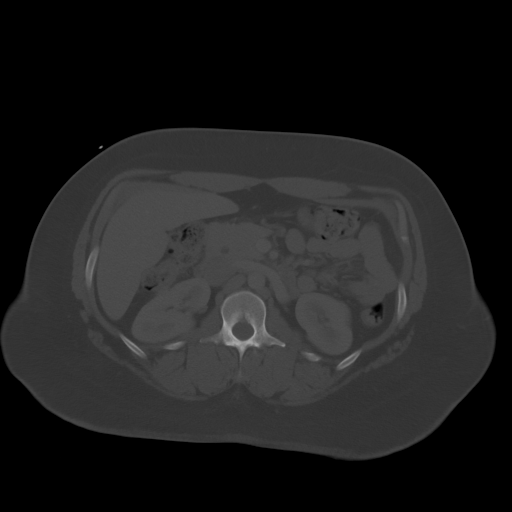
[im 78/108  soft-tissue]
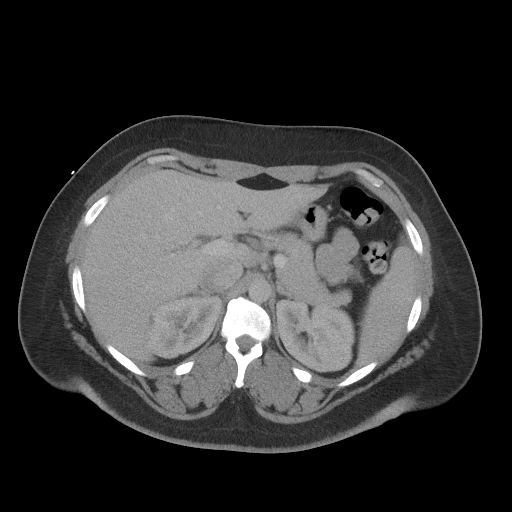
[im 86/108  soft-tissue]
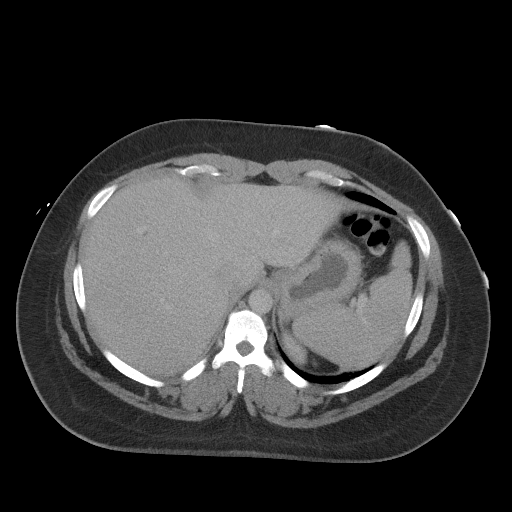
[im 95/108  soft-tissue]
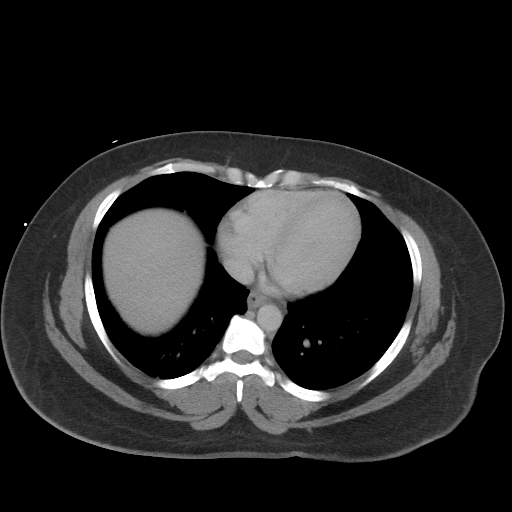
[im 103/108  soft-tissue]
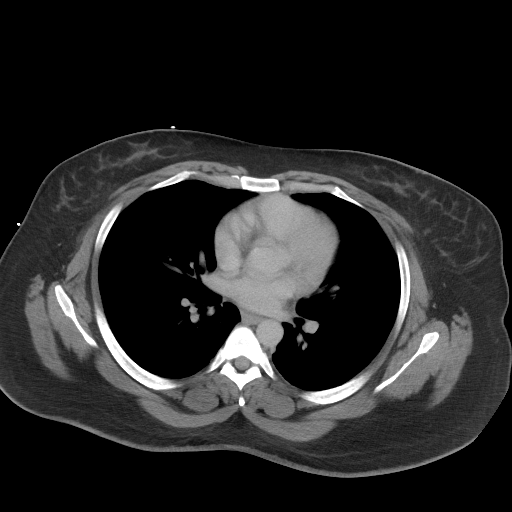

[Series 5: a/p w/ cor · coronal · 0.95mm/px · 3 of 136 slices shown]
[im 46/136  soft-tissue]
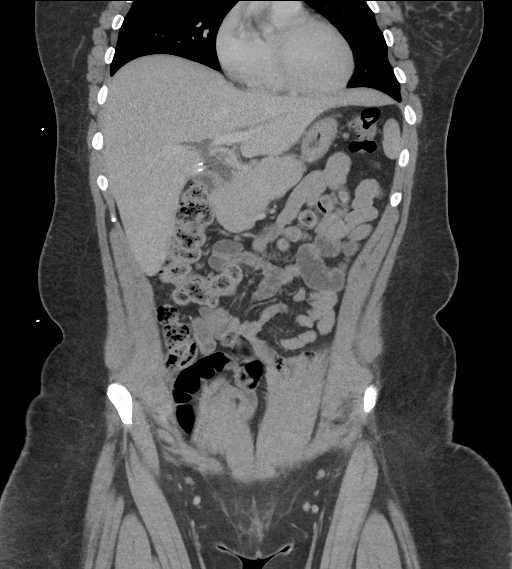
[im 61/136  soft-tissue]
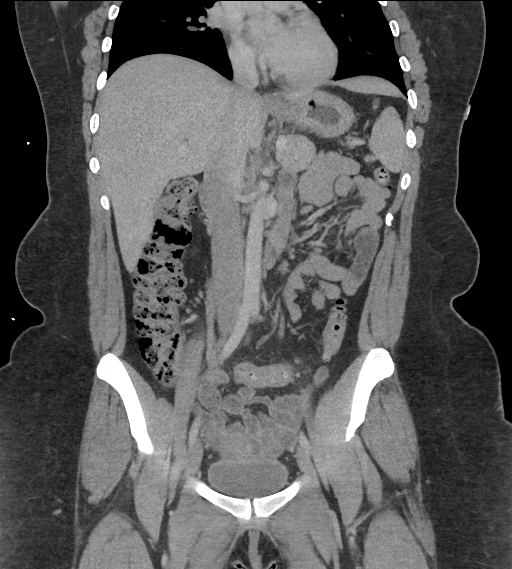
[im 76/136  soft-tissue]
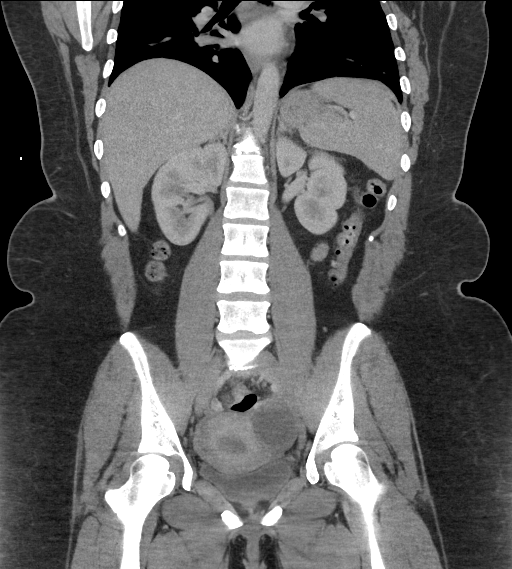

[16 of 46 positions shown; findings below may reference images not displayed]

FINDINGS: The visualized lung bases are clear. No intra-abdominal free air.
Trace free fluid may be present within the pelvis.

Cholecystectomy. The liver, pancreas, spleen, adrenal glands,
kidneys, visualized ureters, and urinary bladder appear
unremarkable. The uterus is anteverted and grossly unremarkable.
There is a 4.5 x 5.3 cm complex cyst with a probable of vertebra on
the containing internal debris in the left adnexa, likely
hemorrhagic cyst. Ultrasound is recommended for better evaluation of
the pelvic structures. The right ovary is grossly unremarkable.

There is sigmoid diverticulosis and scattered colonic diverticula
without active inflammatory changes. Moderate stool noted throughout
the colon. There is no evidence of bowel obstruction or active
inflammation. Normal appendix.

The abdominal aorta and IVC appear unremarkable. No portal venous
gas identified. There is no adenopathy. The abdominal wall soft
tissues appear unremarkable with there is mild degenerative changes
of the spine. No acute fracture.
IMPRESSION: A 4.5 x 5.3 cm complex/hemorrhagic left ovarian cyst. Ultrasound is
recommended for better evaluation of the pelvic structures.

Sigmoid diverticulosis. No evidence of bowel obstruction or active
inflammation. Normal appendix.

## 2018-02-27 ENCOUNTER — Emergency Department (HOSPITAL_COMMUNITY)
Admission: EM | Admit: 2018-02-27 | Discharge: 2018-02-28 | Disposition: A | Discharge status: Home / Self Care | Payer: Medicaid Other | Attending: Emergency Medicine | Admitting: Emergency Medicine

## 2018-02-27 ENCOUNTER — Encounter (HOSPITAL_COMMUNITY): Payer: Self-pay | Admitting: Emergency Medicine

## 2018-02-27 DIAGNOSIS — Z79899 Other long term (current) drug therapy: Secondary | ICD-10-CM | POA: Diagnosis not present

## 2018-02-27 DIAGNOSIS — Z87891 Personal history of nicotine dependence: Secondary | ICD-10-CM | POA: Diagnosis not present

## 2018-02-27 DIAGNOSIS — L02811 Cutaneous abscess of head [any part, except face]: Secondary | ICD-10-CM | POA: Diagnosis not present

## 2018-02-27 DIAGNOSIS — R220 Localized swelling, mass and lump, head: Secondary | ICD-10-CM | POA: Diagnosis present

## 2018-02-27 MED ORDER — IBUPROFEN 800 MG PO TABS
800.0000 mg | ORAL_TABLET | Freq: Once | ORAL | Status: DC
  Filled 2018-02-27: qty 1

## 2018-02-27 MED ORDER — DIPHENHYDRAMINE HCL 25 MG PO CAPS
50.0000 mg | ORAL_CAPSULE | Freq: Once | ORAL | Status: AC
  Administered 2018-02-27: 50 mg via ORAL
  Filled 2018-02-27: qty 2

## 2018-02-27 MED ORDER — SULFAMETHOXAZOLE-TRIMETHOPRIM 800-160 MG PO TABS
1.0000 | ORAL_TABLET | Freq: Once | ORAL | Status: AC
  Administered 2018-02-28: 1 via ORAL

## 2018-02-27 MED ORDER — IBUPROFEN 800 MG PO TABS
800.0000 mg | ORAL_TABLET | Freq: Once | ORAL | Status: AC
  Administered 2018-02-28: 800 mg via ORAL

## 2018-02-27 MED ORDER — SULFAMETHOXAZOLE-TRIMETHOPRIM 800-160 MG PO TABS: 1.0000 | ORAL_TABLET | Freq: Two times a day (BID) | ORAL | 0 refills | Status: AC

## 2018-02-27 MED ORDER — SULFAMETHOXAZOLE-TRIMETHOPRIM 800-160 MG PO TABS
1.0000 | ORAL_TABLET | Freq: Once | ORAL | Status: DC
  Filled 2018-02-27: qty 1

## 2018-02-27 NOTE — ED Triage Notes (Signed)
Pt presents with 3 boils to head in hair; pt states they began last night; denies trauma to head; pain radiating down neck

## 2018-02-27 NOTE — ED Provider Notes (Signed)
TIME SEEN: 11:46 PM  CHIEF COMPLAINT: Bump on the head  HPI: Patient is a 37 year old female with no significant past medical history who presents to the emergency department with a bump to the left side of her scalp with enlarged cervical lymph nodes in the left side of her neck.  States that this started 1 day ago.  States she has noticed some purulent drainage from this area.  States she is not feeling well but denies any fever.  No vomiting.  ROS: See HPI Constitutional: no fever  Eyes: no drainage  ENT: no runny nose   Cardiovascular:  no chest pain  Resp: no SOB  GI: no vomiting GU: no dysuria Integumentary: no rash  Allergy: no hives  Musculoskeletal: no leg swelling  Neurological: no slurred speech ROS otherwise negative  PAST MEDICAL HISTORY/PAST SURGICAL HISTORY:  Past Medical History:  Diagnosis Date  . Anemia   . Bilateral carpal tunnel syndrome   . Chronic back pain   . DDD (degenerative disc disease), lumbar     MEDICATIONS:  Prior to Admission medications   Medication Sig Start Date End Date Taking? Authorizing Provider  acetaminophen-codeine 120-12 MG/5ML solution Take 10 mLs by mouth every 4 (four) hours as needed for moderate pain. 06/12/16   Lawyer, Cristal Deer, PA-C  amoxicillin (AMOXIL) 500 MG capsule Take 1 capsule (500 mg total) by mouth 3 (three) times daily. Patient not taking: Reported on 12/09/2015 03/21/14   Kathrynn Speed, PA-C  fluticasone [facility]) 50 MCG/ACT nasal spray Place 2 sprays into both nostrils daily. Patient not taking: Reported on 12/09/2015 03/21/14   Kathrynn Speed, PA-C  gabapentin (NEURONTIN) 300 MG capsule Take 300 mg by mouth 3 (three) times daily. 10/30/15   [provider]  Guaifenesin 1200 MG TB12 Take 1 tablet (1,200 mg total) by mouth 2 (two) times daily. 06/12/16   Lawyer, Cristal Deer, PA-C  HYDROcodone-acetaminophen (NORCO/VICODIN) 5-325 MG tablet Take 1 tablet by mouth every 4 (four) hours as needed. 12/09/15   Jacalyn Lefevre, MD  ibuprofen (ADVIL,MOTRIN) 800 MG tablet Take 1 tablet (800 mg total) by mouth 3 (three) times daily. Patient not taking: Reported on 12/09/2015 07/11/15   Mady Gemma, PA-C  methocarbamol (ROBAXIN) 500 MG tablet Take 1 tablet (500 mg total) by mouth 2 (two) times daily. Patient not taking: Reported on 12/09/2015 07/11/15   Mady Gemma, PA-C  naproxen (NAPROSYN) 500 MG tablet Take 1 tablet (500 mg total) by mouth 2 (two) times daily. Patient not taking: Reported on 12/09/2015 07/11/14   Harle Battiest, NP  predniSONE (DELTASONE) 50 MG tablet Take 1 tablet (50 mg total) by mouth daily with breakfast. 06/12/16   Lawyer, Cristal Deer, PA-C    ALLERGIES:  No Known Allergies  SOCIAL HISTORY:  Social History   Tobacco Use  . Smoking status: Former Smoker    Last attempt to quit: 06/09/2014    Years since quitting: 3.7  . Smokeless tobacco: Never Used  Substance Use Topics  . Alcohol use: Yes    FAMILY HISTORY: History reviewed. No pertinent family history.  EXAM: BP 124/69 (BP Location: Right Arm)   Pulse 78   Temp 98.8 F (37.1 C) (Oral)   Resp 16   Ht 5\' 9"  (1.753 m)   LMP 01/25/2018   SpO2 99%   BMI 44.30 kg/m  CONSTITUTIONAL: Alert and oriented and responds appropriately to questions. Well-appearing; well-nourished HEAD: Normocephalic, patient has a 1 x 1 cm indurated area to her scalp that is  slightly warm without drainage or fluctuance at this time EYES: Conjunctivae clear, pupils appear equal, EOMI ENT: normal nose; moist mucous membranes NECK: Supple, no meningismus, no nuchal rigidity, patient does have some left-sided cervical lymphadenopathy CARD: RRR; S1 and S2 appreciated; no murmurs, no clicks, no rubs, no gallops RESP: Normal chest excursion without splinting or tachypnea; breath sounds clear and equal bilaterally; no wheezes, no rhonchi, no rales, no hypoxia or respiratory distress, speaking full sentences ABD/GI: Normal bowel sounds;  non-distended; soft, non-tender, no rebound, no guarding, no peritoneal signs, no hepatosplenomegaly BACK:  The back appears normal and is non-tender to palpation, there is no CVA tenderness EXT: Normal ROM in all joints; non-tender to palpation; no edema; normal capillary refill; no cyanosis, no calf tenderness or swelling    SKIN: Normal color for age and race; warm; no rash NEURO: Moves all extremities equally PSYCH: The patient's mood and manner are appropriate. Grooming and personal hygiene are appropriate.  MEDICAL DECISION MAKING: Patient here with what appears to be a scalp abscess.  Given her lymphadenopathy I think this is more likely bacterial than a fungal infection.  It is not amenable to I&D at this time.  Per patient's report, this area has already been draining purulent drainage.  Will discharge on Bactrim.  Recommended alternating Tylenol and Motrin for pain.  Discussed return precautions.  She has a PCP for close follow-up if worsening.  At this time, I do not feel there is any life-threatening condition present. I have reviewed and discussed all results (EKG, imaging, lab, urine as appropriate) and exam findings with patient/family. I have reviewed nursing notes and appropriate previous records.  I feel the patient is safe to be discharged home without further emergent workup and can continue workup as an outpatient as needed. Discussed usual and customary return precautions. Patient/family verbalize understanding and are comfortable with this plan.  Outpatient follow-up has been provided if needed. All questions have been answered.      Tyana Butzer, Layla Maw, DO 02/28/18 [phone]

## 2018-02-27 NOTE — Discharge Instructions (Signed)
You may alternate Tylenol 1000 mg every 6 hours as needed for pain and Ibuprofen 800 mg every 8 hours as needed for pain.  Please take Ibuprofen with food.   If symptoms worsen, please follow up with your PCP.

## 2018-02-27 NOTE — ED Notes (Signed)
Pt c/o generalized itching while in the lobby, no rash noted. Denies SOB. Verbal order for benadryl obtained

## 2018-07-13 ENCOUNTER — Emergency Department (HOSPITAL_COMMUNITY)
Admission: EM | Admit: 2018-07-13 | Discharge: 2018-07-14 | Disposition: A | Discharge status: Home / Self Care | Payer: Medicaid Other | Attending: Emergency Medicine | Admitting: Emergency Medicine

## 2018-07-13 ENCOUNTER — Other Ambulatory Visit: Payer: Self-pay

## 2018-07-13 ENCOUNTER — Encounter (HOSPITAL_COMMUNITY): Payer: Self-pay | Admitting: *Deleted

## 2018-07-13 DIAGNOSIS — G8929 Other chronic pain: Secondary | ICD-10-CM

## 2018-07-13 DIAGNOSIS — Y998 Other external cause status: Secondary | ICD-10-CM | POA: Insufficient documentation

## 2018-07-13 DIAGNOSIS — Z87891 Personal history of nicotine dependence: Secondary | ICD-10-CM | POA: Diagnosis not present

## 2018-07-13 DIAGNOSIS — W101XXA Fall (on)(from) sidewalk curb, initial encounter: Secondary | ICD-10-CM | POA: Insufficient documentation

## 2018-07-13 DIAGNOSIS — Y939 Activity, unspecified: Secondary | ICD-10-CM | POA: Insufficient documentation

## 2018-07-13 DIAGNOSIS — Z79899 Other long term (current) drug therapy: Secondary | ICD-10-CM | POA: Diagnosis not present

## 2018-07-13 DIAGNOSIS — Y92219 Unspecified school as the place of occurrence of the external cause: Secondary | ICD-10-CM | POA: Diagnosis not present

## 2018-07-13 DIAGNOSIS — M545 Low back pain: Secondary | ICD-10-CM | POA: Insufficient documentation

## 2018-07-13 MED ORDER — HYDROMORPHONE HCL 1 MG/ML IJ SOLN
1.0000 mg | Freq: Once | INTRAMUSCULAR | Status: AC
  Administered 2018-07-14: 1 mg via INTRAMUSCULAR
  Filled 2018-07-13: qty 1

## 2018-07-13 MED ORDER — DEXAMETHASONE SODIUM PHOSPHATE 10 MG/ML IJ SOLN
10.0000 mg | Freq: Once | INTRAMUSCULAR | Status: AC
  Administered 2018-07-14: 10 mg via INTRAMUSCULAR
  Filled 2018-07-13: qty 1

## 2018-07-13 MED ORDER — KETOROLAC TROMETHAMINE 60 MG/2ML IM SOLN
60.0000 mg | Freq: Once | INTRAMUSCULAR | Status: AC
  Administered 2018-07-14: 60 mg via INTRAMUSCULAR
  Filled 2018-07-13: qty 2

## 2018-07-13 MED ORDER — METHOCARBAMOL 500 MG PO TABS
1000.0000 mg | ORAL_TABLET | Freq: Once | ORAL | Status: AC
  Administered 2018-07-14: 1000 mg via ORAL
  Filled 2018-07-13: qty 2

## 2018-07-13 MED ORDER — LIDOCAINE 5 % EX PTCH
1.0000 | MEDICATED_PATCH | CUTANEOUS | Status: DC
  Administered 2018-07-14: 1 via TRANSDERMAL
  Filled 2018-07-13: qty 1

## 2018-07-13 NOTE — ED Triage Notes (Signed)
Pt reports that she slipped and fell tonight, having pain in the lower back/tailbone that radiates down the right leg. Says she has pain everyday anyway from hx of bulging, herniated and degenerative disc in the lumbar and sacral area. No meds PTA.

## 2018-07-13 NOTE — ED Provider Notes (Signed)
[facility] EMERGENCY DEPARTMENT Provider Note   CSN: [id number] Arrival date & time: 07/13/18  2221     History   Chief Complaint Chief Complaint  Patient presents with  . Fall    HPI Maria Mcmahon is a 38 y.o. female.   38 year old female presents to the emergency department for evaluation of low back pain.  She has a history of chronic low back pain at baseline.  States that she was leaving school when she missed stepped on the curb causing her to fall landing on her low back.  She has been experiencing constant pain which worsens with movement as well as ambulation.  Pain radiates down her right leg.  Is already on Flexeril, prednisone, Norco at baseline.  Is followed by a spine specialist and was told that her only option for management is surgery.  She has not had any bowel or bladder incontinence, extremity or anogenital numbness.  No hx of IVDU.  The history is provided by the patient. No language interpreter was used.  Fall     Past Medical History:  Diagnosis Date  . Anemia   . Bilateral carpal tunnel syndrome   . Chronic back pain   . DDD (degenerative disc disease), lumbar     There are no active problems to display for this patient.   Past Surgical History:  Procedure Laterality Date  . CERVICAL BIOPSY    . CHOLECYSTECTOMY    . ROTATOR CUFF REPAIR  2015     OB History   No obstetric history on file.      Home Medications    Prior to Admission medications   Medication Sig Start Date End Date Taking? Authorizing Provider  acetaminophen-codeine 120-12 MG/5ML solution Take 10 mLs by mouth every 4 (four) hours as needed for moderate pain. 06/12/16   Lawyer, Cristal Deer, PA-C  amoxicillin (AMOXIL) 500 MG capsule Take 1 capsule (500 mg total) by mouth 3 (three) times daily. Patient not taking: Reported on 12/09/2015 03/21/14   Kathrynn Speed, PA-C  fluticasone [facility]) 50 MCG/ACT nasal spray Place 2 sprays into both nostrils  daily. Patient not taking: Reported on 12/09/2015 03/21/14   Kathrynn Speed, PA-C  gabapentin (NEURONTIN) 300 MG capsule Take 300 mg by mouth 3 (three) times daily. 10/30/15   [provider]  Guaifenesin 1200 MG TB12 Take 1 tablet (1,200 mg total) by mouth 2 (two) times daily. 06/12/16   Lawyer, Cristal Deer, PA-C  HYDROcodone-acetaminophen (NORCO/VICODIN) 5-325 MG tablet Take 1 tablet by mouth every 4 (four) hours as needed. 12/09/15   Jacalyn Lefevre, MD  ibuprofen (ADVIL,MOTRIN) 800 MG tablet Take 1 tablet (800 mg total) by mouth 3 (three) times daily. Patient not taking: Reported on 12/09/2015 07/11/15   Mady Gemma, PA-C  methocarbamol (ROBAXIN) 500 MG tablet Take 1 tablet (500 mg total) by mouth 2 (two) times daily. Patient not taking: Reported on 12/09/2015 07/11/15   Mady Gemma, PA-C  naproxen (NAPROSYN) 500 MG tablet Take 1 tablet (500 mg total) by mouth 2 (two) times daily. Patient not taking: Reported on 12/09/2015 07/11/14   Harle Battiest, NP  predniSONE (DELTASONE) 50 MG tablet Take 1 tablet (50 mg total) by mouth daily with breakfast. 06/12/16   Charlestine Night, PA-C    Family History No family history on file.  Social History Social History   Tobacco Use  . Smoking status: Former Smoker    Last attempt to quit: 06/09/2014    Years since quitting: 4.0  .  Smokeless tobacco: Never Used  Substance Use Topics  . Alcohol use: Yes  . Drug use: No     Allergies   Patient has no known allergies.   Review of Systems Review of Systems Ten systems reviewed and are negative for acute change, except as noted in the HPI.    Physical Exam Updated Vital Signs BP 110/72 (BP Location: Right Arm)   Pulse 80   Temp 98.5 F (36.9 C) (Oral)   Resp 18   LMP 07/02/2018   SpO2 100%   Physical Exam Vitals signs and nursing note reviewed.  Constitutional:      General: She is not in acute distress.    Appearance: She is well-developed. She is not  diaphoretic.     Comments: Nontoxic appearing, obese. Tearful.   HENT:     Head: Normocephalic and atraumatic.  Eyes:     General: No scleral icterus.    Conjunctiva/sclera: Conjunctivae normal.  Neck:     Musculoskeletal: Normal range of motion.  Cardiovascular:     Rate and Rhythm: Normal rate and regular rhythm.     Pulses: Normal pulses.     Comments: DP pulse 2+ bilaterally Pulmonary:     Effort: Pulmonary effort is normal. No respiratory distress.     Comments: Respirations even and unlabored Musculoskeletal: Normal range of motion.     Lumbar back: She exhibits tenderness and pain. She exhibits no bony tenderness and no spasm.       Back:     Comments: No tenderness to palpation to the lumbosacral midline.  No bony deformities, step-offs, crepitus.  There is positive straight leg raise on the right.  Negative crossed straight leg raise.  Skin:    General: Skin is warm and dry.     Coloration: Skin is not pale.     Findings: No erythema or rash.  Neurological:     Mental Status: She is alert and oriented to person, place, and time.     Sensory: No sensory deficit.     Deep Tendon Reflexes:     Reflex Scores:      Patellar reflexes are 1+ on the right side and 1+ on the left side.    Comments: Normal plantarflexion and dorsiflexion against resistance on the right.  Psychiatric:        Behavior: Behavior normal.      ED Treatments / Results  Labs (all labs ordered are listed, but only abnormal results are displayed) Labs Reviewed - No data to display  EKG None  Radiology No results found.  Procedures Procedures (including critical care time)  Medications Ordered in ED Medications  lidocaine (LIDODERM) 5 % 1 patch (1 patch Transdermal Patch Applied 07/14/18 0017)  ketorolac (TORADOL) injection 60 mg (60 mg Intramuscular Given 07/14/18 0004)  HYDROmorphone (DILAUDID) injection 1 mg (1 mg Intramuscular Given 07/14/18 0008)  methocarbamol (ROBAXIN) tablet 1,000  mg (1,000 mg Oral Given 07/14/18 0003)  dexamethasone (DECADRON) injection 10 mg (10 mg Intramuscular Given 07/14/18 0011)    12:36 AM Pain is improved with medications.  Patient with much more ease to movement.  Has been ambulatory short distances in the ED since arrival.   Initial Impression / Assessment and Plan / ED Course  I have reviewed the triage vital signs and the nursing notes.  Pertinent labs & imaging results that were available during my care of the patient were reviewed by me and considered in my medical decision making (see chart for details).  Patient with back pain following a fall today.  History of chronic back pain for which she is followed by her primary care doctor as well as a spine specialist.  States that her pain has worsened since the fall.  Patient can walk but states is painful.  She is neurovascularly intact on exam.  No concern for cauda equina.  No history of cancer or IV drug use.  She has had symptomatic management in the emergency department following anti-inflammatories and a dose of 1 mg Dilaudid.  I have encouraged home use of prednisone, Norco, Flexeril as previously prescribed.  Patient to follow up with her PCP for recheck.  Return precautions discussed and provided. Patient discharged in stable condition with no unaddressed concerns.   Final Clinical Impressions(s) / ED Diagnoses   Final diagnoses:  Acute exacerbation of chronic low back pain    ED Discharge Orders    None       Antony MaduraHumes, Nidya Bouyer, PA-C 07/14/18 0038    Dione BoozeGlick, David, MD 07/14/18 [phone]

## 2018-07-14 MED ORDER — ONDANSETRON 4 MG PO TBDP
8.0000 mg | ORAL_TABLET | Freq: Once | ORAL | Status: AC
  Administered 2018-07-14: 8 mg via ORAL
  Filled 2018-07-14: qty 2

## 2018-07-14 NOTE — Discharge Instructions (Addendum)
Continue your prescribed medications for management of pain.  Alternate ice and heat to areas of injury to limit inflammation and spasm.  You may try over-the-counter Salonpas patches with lidocaine or Icy Hot patches with lidocaine.  These can be applied topically to help with pain control.  Avoid strenuous activity and heavy lifting.  Follow-up with your primary care doctor and/or spine specialist.  Return to the ED for new or concerning symptoms.

## 2019-11-08 ENCOUNTER — Emergency Department (HOSPITAL_COMMUNITY)
Admission: EM | Admit: 2019-11-08 | Discharge: 2019-11-08 | Disposition: A | Discharge status: Home / Self Care | Payer: Medicaid Other | Attending: Emergency Medicine | Admitting: Emergency Medicine

## 2019-11-08 ENCOUNTER — Encounter (HOSPITAL_COMMUNITY): Payer: Self-pay

## 2019-11-08 DIAGNOSIS — U071 COVID-19: Secondary | ICD-10-CM | POA: Diagnosis not present

## 2019-11-08 DIAGNOSIS — J069 Acute upper respiratory infection, unspecified: Secondary | ICD-10-CM

## 2019-11-08 DIAGNOSIS — Z87891 Personal history of nicotine dependence: Secondary | ICD-10-CM | POA: Insufficient documentation

## 2019-11-08 DIAGNOSIS — R509 Fever, unspecified: Secondary | ICD-10-CM | POA: Diagnosis present

## 2019-11-08 LAB — URINALYSIS, ROUTINE W REFLEX MICROSCOPIC
Bilirubin Urine: NEGATIVE
Glucose, UA: NEGATIVE mg/dL
Hgb urine dipstick: NEGATIVE
Ketones, ur: NEGATIVE mg/dL
Leukocytes,Ua: NEGATIVE
Nitrite: NEGATIVE
Protein, ur: NEGATIVE mg/dL
Specific Gravity, Urine: 1.014 (ref 1.005–1.030)
pH: 5 (ref 5.0–8.0)

## 2019-11-08 LAB — SARS CORONAVIRUS 2 (TAT 6-24 HRS): SARS Coronavirus 2: POSITIVE — AB

## 2019-11-08 NOTE — ED Provider Notes (Signed)
[facility] EMERGENCY DEPARTMENT Provider Note   CSN: [id number] Arrival date & time: 11/08/19  1053     History Chief Complaint  Patient presents with  . Fever    Maria Mcmahon is a 39 y.o. female who presents with a fever.  She states that she went to her daughter's gender revealed celebration on Monday.  It was held outside and she states that she felt very fatigued afterwards.  She thought her symptoms may be due to allergies since she was outside. She went home and slept for the rest of the day and all night.  She woke up the next day and was congested and reports a cough.  Cough was productive of clear sputum and she had a low-grade temp of 100.4.  She started Mucinex which has been helping with the cough.  Today she denies any fever but wanted to get checked out.  She denies headache, sore throat, chest pain, shortness of breath, anosmia, abdominal pain, nausea, vomiting or diarrhea.  She was around multiple people on Monday at her daughter's party.  She has not had Covid or been vaccinated yet.  HPI     Past Medical History:  Diagnosis Date  . Anemia   . Bilateral carpal tunnel syndrome   . Chronic back pain   . DDD (degenerative disc disease), lumbar     There are no problems to display for this patient.   Past Surgical History:  Procedure Laterality Date  . CERVICAL BIOPSY    . CHOLECYSTECTOMY    . ROTATOR CUFF REPAIR  2015     OB History   No obstetric history on file.     No family history on file.  Social History   Tobacco Use  . Smoking status: Former Smoker    Quit date: 06/09/2014    Years since quitting: 5.4  . Smokeless tobacco: Never Used  Substance Use Topics  . Alcohol use: Yes  . Drug use: No    Home Medications Prior to Admission medications   Medication Sig Start Date End Date Taking? Authorizing Provider  acetaminophen-codeine 120-12 MG/5ML solution Take 10 mLs by mouth every 4 (four) hours as needed for  moderate pain. 06/12/16   Lawyer, Cristal Deer, PA-C  gabapentin (NEURONTIN) 300 MG capsule Take 300 mg by mouth 3 (three) times daily. 10/30/15   [provider]  Guaifenesin 1200 MG TB12 Take 1 tablet (1,200 mg total) by mouth 2 (two) times daily. 06/12/16   Lawyer, Cristal Deer, PA-C  HYDROcodone-acetaminophen (NORCO/VICODIN) 5-325 MG tablet Take 1 tablet by mouth every 4 (four) hours as needed. 12/09/15   Jacalyn Lefevre, MD  naproxen (NAPROSYN) 500 MG tablet Take 1 tablet (500 mg total) by mouth 2 (two) times daily. Patient not taking: Reported on 12/09/2015 07/11/14   Harle Battiest, NP  predniSONE (DELTASONE) 50 MG tablet Take 1 tablet (50 mg total) by mouth daily with breakfast. 06/12/16   Lawyer, Cristal Deer, PA-C  fluticasone (FLONASE) 50 MCG/ACT nasal spray Place 2 sprays into both nostrils daily. Patient not taking: Reported on 12/09/2015 03/21/14 11/08/19  Kathrynn Speed, PA-C    Allergies    Patient has no known allergies.  Review of Systems   Review of Systems  Constitutional: Positive for fatigue and fever. Negative for chills.  HENT: Positive for congestion. Negative for ear pain, rhinorrhea, sinus pressure and sinus pain.   Respiratory: Positive for cough. Negative for shortness of breath.   Cardiovascular: Negative for chest pain.  Gastrointestinal: Negative  for abdominal pain, diarrhea, nausea and vomiting.    Physical Exam Updated Vital Signs BP 119/77 (BP Location: Left Arm)   Pulse 83   Temp 98.9 F (37.2 C) (Oral)   Resp 14   Ht 5\' 9"  (1.753 m)   Wt 135.6 kg   SpO2 98%   BMI 44.15 kg/m   Physical Exam Vitals and nursing note reviewed.  Constitutional:      General: She is not in acute distress.    Appearance: She is well-developed. She is obese. She is not ill-appearing.  HENT:     Head: Normocephalic and atraumatic.     Right Ear: Tympanic membrane normal.     Left Ear: Tympanic membrane normal.     Nose: Nose normal.     Mouth/Throat:      Mouth: Mucous membranes are moist.  Eyes:     General: No scleral icterus.       Right eye: No discharge.        Left eye: No discharge.     Conjunctiva/sclera: Conjunctivae normal.     Pupils: Pupils are equal, round, and reactive to light.  Cardiovascular:     Rate and Rhythm: Normal rate and regular rhythm.  Pulmonary:     Effort: Pulmonary effort is normal. No respiratory distress.     Breath sounds: Normal breath sounds.  Abdominal:     General: There is no distension.  Musculoskeletal:     Cervical back: Normal range of motion.  Skin:    General: Skin is warm and dry.  Neurological:     Mental Status: She is alert and oriented to person, place, and time.  Psychiatric:        Behavior: Behavior normal.     ED Results / Procedures / Treatments   Labs (all labs ordered are listed, but only abnormal results are displayed) Labs Reviewed  SARS CORONAVIRUS 2 (TAT 6-24 HRS)  URINALYSIS, ROUTINE W REFLEX MICROSCOPIC    EKG None  Radiology No results found.  Procedures Procedures (including critical care time)  Medications Ordered in ED Medications - No data to display  ED Course  I have reviewed the triage vital signs and the nursing notes.  Pertinent labs & imaging results that were available during my care of the patient were reviewed by me and considered in my medical decision making (see chart for details).  39 year old female presents with low-grade fever and URI symptoms with a cough.  Her vital signs are normal.  Her exam is unremarkable.  We will send off a Covid test but I do not feel a chest x-ray is warranted at this time.  Patient advised to continue supportive care and to quarantine for the next 24 hours until she gets her Covid test results.  Maria Mcmahon was evaluated in Emergency Department on 11/08/2019 for the symptoms described in the history of present illness. She was evaluated in the context of the global COVID-19 pandemic, which necessitated  consideration that the patient might be at risk for infection with the SARS-CoV-2 virus that causes COVID-19. Institutional protocols and algorithms that pertain to the evaluation of patients at risk for COVID-19 are in a state of rapid change based on information released by regulatory bodies including the CDC and federal and state organizations. These policies and algorithms were followed during the patient's care in the ED.  MDM Rules/Calculators/A&P  Final Clinical Impression(s) / ED Diagnoses Final diagnoses:  Upper respiratory tract infection, unspecified type    Rx / DC Orders ED Discharge Orders    None       Bethel Born, PA-C 11/08/19 1407    Arby Barrette, MD 11/28/19 1624

## 2019-11-08 NOTE — ED Notes (Signed)
Patient states she was outside a lot this past weekend and she has seasonal allergy, states Monday slept all day , Tues felt congested with cough and states she slept more, had temp of 100.4 Wed. Temp was down to 99 and today she feels better however still tired, states she had a physical last week and was told she had a poss. Left ear infection.

## 2019-11-08 NOTE — Discharge Instructions (Signed)
Please rest and drink plenty of fluids Continue Tylenol or Ibuprofen for fever Continue Mucinex for cough and congestion Please quarantine until you get the results of your COVID test which can take up to 24 hours Return if you are worsening

## 2019-11-08 NOTE — ED Triage Notes (Signed)
Pt arrives to ED w/ c/o nasal congestion, fatigue, and fever.

## 2019-11-09 ENCOUNTER — Other Ambulatory Visit: Payer: Self-pay | Admitting: Physician Assistant

## 2019-11-09 ENCOUNTER — Telehealth: Payer: Self-pay | Admitting: Physician Assistant

## 2019-11-09 ENCOUNTER — Ambulatory Visit (HOSPITAL_COMMUNITY)
Admission: RE | Admit: 2019-11-09 | Discharge: 2019-11-09 | Disposition: A | Discharge status: Home / Self Care | Payer: Medicaid Other | Source: Ambulatory Visit | Attending: Pulmonary Disease | Admitting: Pulmonary Disease

## 2019-11-09 DIAGNOSIS — U071 COVID-19: Secondary | ICD-10-CM

## 2019-11-09 MED ORDER — ALBUTEROL SULFATE HFA 108 (90 BASE) MCG/ACT IN AERS: 2.0000 | INHALATION_SPRAY | Freq: Once | RESPIRATORY_TRACT | Status: DC | PRN

## 2019-11-09 MED ORDER — DIPHENHYDRAMINE HCL 50 MG/ML IJ SOLN: 50.0000 mg | Freq: Once | INTRAMUSCULAR | Status: DC | PRN

## 2019-11-09 MED ORDER — SODIUM CHLORIDE 0.9 % IV SOLN: INTRAVENOUS | Status: DC | PRN

## 2019-11-09 MED ORDER — METHYLPREDNISOLONE SODIUM SUCC 125 MG IJ SOLR: 125.0000 mg | Freq: Once | INTRAMUSCULAR | Status: DC | PRN

## 2019-11-09 MED ORDER — FAMOTIDINE IN NACL 20-0.9 MG/50ML-% IV SOLN: 20.0000 mg | Freq: Once | INTRAVENOUS | Status: DC | PRN

## 2019-11-09 MED ORDER — SODIUM CHLORIDE 0.9 % IV SOLN
Freq: Once | INTRAVENOUS | Status: AC
  Filled 2019-11-09: qty 20

## 2019-11-09 MED ORDER — EPINEPHRINE 0.3 MG/0.3ML IJ SOAJ: 0.3000 mg | Freq: Once | INTRAMUSCULAR | Status: DC | PRN

## 2019-11-09 NOTE — Telephone Encounter (Signed)
Called to discuss with Maria Mcmahon about Covid symptoms and the use of bamlanivimab/etesevimab or casirivimab/imdevimab, a monoclonal antibody infusion for those with mild to moderate Covid symptoms and at a high risk of hospitalization.     Pt is qualified for this infusion at the [facility] infusion center due to co-morbid conditions and/or a member of an at-risk group (BMI >25), however would like to think about infusion at this time. Symptoms tier reviewed as well as criteria for ending isolation.  Symptoms reviewed that would warrant ED/Hospital evaluation. Preventative practices reviewed. Patient verbalized understanding. Patient advised to call back if he decides that he does want to get infusion. Callback number to the infusion center given. Patient advised to go to Urgent care or ED with severe symptoms. Last date pt would be eligible for infusion is 5/26.   I will call her back in an hour or so.    Cline Crock PA-C

## 2019-11-09 NOTE — Progress Notes (Signed)
  Diagnosis: COVID-19  Physician:Dr Wright  Procedure: Covid Infusion Clinic Med: bamlanivimab\etesevimab infusion - Provided patient with bamlanimivab\etesevimab fact sheet for patients, parents and caregivers prior to infusion.  Complications: No immediate complications noted.  Discharge: Discharged home   Maria Mcmahon 11/09/2019  

## 2019-11-09 NOTE — Progress Notes (Signed)
  I connected by phone with Maria Mcmahon on 11/09/2019 at 9:29 AM to discuss the potential use of an new treatment for mild to moderate COVID-19 viral infection in non-hospitalized patients.  This patient is a 39 y.o. female that meets the FDA criteria for Emergency Use Authorization of bamlanivimab/etesevimab or casirivimab/imdevimab.  Has a (+) direct SARS-CoV-2 viral test result  Has mild or moderate COVID-19   Is ? 40 years of age and weighs ? 40 kg  Is NOT hospitalized due to COVID-19  Is NOT requiring oxygen therapy or requiring an increase in baseline oxygen flow rate due to COVID-19  Is within 10 days of symptom onset  Has at least one of the high risk factor(s) for progression to severe COVID-19 and/or hospitalization as defined in EUA.  Specific high risk criteria : BMI >/= 35   I have spoken and communicated the following to the patient or parent/caregiver:  1. FDA has authorized the emergency use of bamlanivimab/etesevimab and casirivimab\imdevimab for the treatment of mild to moderate COVID-19 in adults and pediatric patients with positive results of direct SARS-CoV-2 viral testing who are 37 years of age and older weighing at least 40 kg, and who are at high risk for progressing to severe COVID-19 and/or hospitalization.  2. The significant known and potential risks and benefits of bamlanivimab/etesevimab and casirivimab\imdevimab, and the extent to which such potential risks and benefits are unknown.  3. Information on available alternative treatments and the risks and benefits of those alternatives, including clinical trials.  4. Patients treated with bamlanivimab/etesevimab and casirivimab\imdevimab should continue to self-isolate and use infection control measures (e.g., wear mask, isolate, social distance, avoid sharing personal items, clean and disinfect "high touch" surfaces, and frequent handwashing) according to CDC guidelines.   5. The patient or  parent/caregiver has the option to accept or refuse bamlanivimab/etesevimab or casirivimab\imdevimab .  After reviewing this information with the patient, The patient agreed to proceed with receiving the bamlanivimab/etesevimab infusion and will be provided a copy of the Fact sheet prior to receiving the infusion.    Sx onset 5/16. Set up for infusion today at 12:30pm   Cline Crock 11/09/2019 9:29 AM

## 2019-11-09 NOTE — Discharge Instructions (Signed)
